# Patient Record
Sex: Male | Born: 1950
Health system: Southern US, Community
[De-identification: ages and names within clinical notes are randomized; demographics above are authoritative.]

## PROBLEM LIST (undated history)

## (undated) DIAGNOSIS — M109 Gout, unspecified: Secondary | ICD-10-CM

## (undated) DIAGNOSIS — Z87442 Personal history of urinary calculi: Secondary | ICD-10-CM

## (undated) DIAGNOSIS — M810 Age-related osteoporosis without current pathological fracture: Secondary | ICD-10-CM

## (undated) DIAGNOSIS — IMO0001 Reserved for inherently not codable concepts without codable children: Secondary | ICD-10-CM

## (undated) DIAGNOSIS — K219 Gastro-esophageal reflux disease without esophagitis: Secondary | ICD-10-CM

## (undated) DIAGNOSIS — E119 Type 2 diabetes mellitus without complications: Secondary | ICD-10-CM

## (undated) DIAGNOSIS — I10 Essential (primary) hypertension: Secondary | ICD-10-CM

## (undated) HISTORY — DX: Gastro-esophageal reflux disease without esophagitis: K21.9

## (undated) HISTORY — DX: Reserved for inherently not codable concepts without codable children: IMO0001

## (undated) HISTORY — PX: LITHOTRIPSY: SUR834

---

## 1998-03-20 ENCOUNTER — Emergency Department (HOSPITAL_COMMUNITY): Admission: EM | Admit: 1998-03-20 | Discharge: 1998-03-21 | Payer: Self-pay | Admitting: Emergency Medicine

## 1999-09-27 ENCOUNTER — Encounter: Payer: Self-pay | Admitting: Emergency Medicine

## 1999-09-27 ENCOUNTER — Emergency Department (HOSPITAL_COMMUNITY): Admission: EM | Admit: 1999-09-27 | Discharge: 1999-09-27 | Payer: Self-pay | Admitting: Emergency Medicine

## 1999-10-01 ENCOUNTER — Encounter: Payer: Self-pay | Admitting: Urology

## 1999-10-01 ENCOUNTER — Ambulatory Visit (HOSPITAL_COMMUNITY): Admission: RE | Admit: 1999-10-01 | Discharge: 1999-10-01 | Payer: Self-pay | Admitting: Urology

## 1999-10-01 ENCOUNTER — Encounter: Admission: RE | Admit: 1999-10-01 | Discharge: 1999-10-01 | Payer: Self-pay | Admitting: Urology

## 1999-10-06 ENCOUNTER — Encounter: Payer: Self-pay | Admitting: Urology

## 1999-10-08 ENCOUNTER — Encounter: Payer: Self-pay | Admitting: Urology

## 1999-10-08 ENCOUNTER — Ambulatory Visit (HOSPITAL_COMMUNITY): Admission: RE | Admit: 1999-10-08 | Discharge: 1999-10-08 | Payer: Self-pay | Admitting: Urology

## 1999-11-02 ENCOUNTER — Encounter: Payer: Self-pay | Admitting: Urology

## 1999-11-02 ENCOUNTER — Ambulatory Visit (HOSPITAL_COMMUNITY): Admission: RE | Admit: 1999-11-02 | Discharge: 1999-11-02 | Payer: Self-pay | Admitting: Urology

## 2000-08-12 ENCOUNTER — Encounter: Payer: Self-pay | Admitting: Family Medicine

## 2000-08-12 ENCOUNTER — Encounter: Admission: RE | Admit: 2000-08-12 | Discharge: 2000-08-12 | Payer: Self-pay | Admitting: Family Medicine

## 2001-02-17 ENCOUNTER — Encounter: Payer: Self-pay | Admitting: Family Medicine

## 2001-02-17 ENCOUNTER — Encounter: Admission: RE | Admit: 2001-02-17 | Discharge: 2001-02-17 | Payer: Self-pay | Admitting: Family Medicine

## 2001-10-04 ENCOUNTER — Ambulatory Visit (HOSPITAL_COMMUNITY): Admission: RE | Admit: 2001-10-04 | Discharge: 2001-10-04 | Payer: Self-pay | Admitting: Gastroenterology

## 2001-10-04 ENCOUNTER — Encounter (INDEPENDENT_AMBULATORY_CARE_PROVIDER_SITE_OTHER): Payer: Self-pay | Admitting: *Deleted

## 2002-09-03 ENCOUNTER — Encounter: Payer: Self-pay | Admitting: Family Medicine

## 2002-09-03 ENCOUNTER — Encounter: Admission: RE | Admit: 2002-09-03 | Discharge: 2002-09-03 | Payer: Self-pay | Admitting: Family Medicine

## 2002-10-27 ENCOUNTER — Emergency Department (HOSPITAL_COMMUNITY): Admission: EM | Admit: 2002-10-27 | Discharge: 2002-10-27 | Payer: Self-pay | Admitting: Emergency Medicine

## 2002-10-27 ENCOUNTER — Encounter: Payer: Self-pay | Admitting: Emergency Medicine

## 2002-10-29 ENCOUNTER — Encounter: Admission: RE | Admit: 2002-10-29 | Discharge: 2002-10-29 | Payer: Self-pay | Admitting: Urology

## 2002-10-29 ENCOUNTER — Encounter: Payer: Self-pay | Admitting: Urology

## 2002-10-31 ENCOUNTER — Encounter: Admission: RE | Admit: 2002-10-31 | Discharge: 2002-10-31 | Payer: Self-pay | Admitting: Urology

## 2002-10-31 ENCOUNTER — Encounter: Payer: Self-pay | Admitting: Urology

## 2002-11-01 ENCOUNTER — Ambulatory Visit (HOSPITAL_BASED_OUTPATIENT_CLINIC_OR_DEPARTMENT_OTHER): Admission: RE | Admit: 2002-11-01 | Discharge: 2002-11-01 | Payer: Self-pay | Admitting: Urology

## 2002-11-01 ENCOUNTER — Encounter: Payer: Self-pay | Admitting: Urology

## 2002-11-14 ENCOUNTER — Encounter: Admission: RE | Admit: 2002-11-14 | Discharge: 2002-11-14 | Payer: Self-pay | Admitting: Urology

## 2002-11-14 ENCOUNTER — Encounter: Payer: Self-pay | Admitting: Urology

## 2002-12-07 ENCOUNTER — Encounter: Admission: RE | Admit: 2002-12-07 | Discharge: 2002-12-07 | Payer: Self-pay | Admitting: Urology

## 2002-12-07 ENCOUNTER — Encounter: Payer: Self-pay | Admitting: Urology

## 2002-12-17 ENCOUNTER — Ambulatory Visit (HOSPITAL_BASED_OUTPATIENT_CLINIC_OR_DEPARTMENT_OTHER): Admission: RE | Admit: 2002-12-17 | Discharge: 2002-12-17 | Payer: Self-pay | Admitting: Urology

## 2002-12-17 ENCOUNTER — Encounter: Payer: Self-pay | Admitting: Urology

## 2002-12-27 ENCOUNTER — Encounter: Payer: Self-pay | Admitting: Urology

## 2002-12-27 ENCOUNTER — Encounter: Admission: RE | Admit: 2002-12-27 | Discharge: 2002-12-27 | Payer: Self-pay | Admitting: Urology

## 2003-02-27 ENCOUNTER — Encounter: Payer: Self-pay | Admitting: Family Medicine

## 2003-02-27 ENCOUNTER — Encounter: Admission: RE | Admit: 2003-02-27 | Discharge: 2003-02-27 | Payer: Self-pay | Admitting: Family Medicine

## 2004-11-17 ENCOUNTER — Ambulatory Visit (HOSPITAL_COMMUNITY): Admission: RE | Admit: 2004-11-17 | Discharge: 2004-11-17 | Payer: Self-pay | Admitting: Gastroenterology

## 2004-11-17 ENCOUNTER — Encounter (INDEPENDENT_AMBULATORY_CARE_PROVIDER_SITE_OTHER): Payer: Self-pay | Admitting: *Deleted

## 2004-12-12 ENCOUNTER — Emergency Department (HOSPITAL_COMMUNITY): Admission: EM | Admit: 2004-12-12 | Discharge: 2004-12-12 | Payer: Self-pay | Admitting: *Deleted

## 2005-05-01 ENCOUNTER — Emergency Department (HOSPITAL_COMMUNITY): Admission: EM | Admit: 2005-05-01 | Discharge: 2005-05-01 | Payer: Self-pay | Admitting: Emergency Medicine

## 2005-06-08 ENCOUNTER — Encounter: Admission: RE | Admit: 2005-06-08 | Discharge: 2005-08-19 | Payer: Self-pay | Admitting: Orthopedic Surgery

## 2005-12-02 ENCOUNTER — Encounter: Admission: RE | Admit: 2005-12-02 | Discharge: 2005-12-02 | Payer: Self-pay | Admitting: Family Medicine

## 2008-08-21 ENCOUNTER — Emergency Department (HOSPITAL_BASED_OUTPATIENT_CLINIC_OR_DEPARTMENT_OTHER): Admission: EM | Admit: 2008-08-21 | Discharge: 2008-08-21 | Payer: Self-pay | Admitting: Emergency Medicine

## 2008-09-09 ENCOUNTER — Encounter: Admission: RE | Admit: 2008-09-09 | Discharge: 2008-09-09 | Payer: Self-pay | Admitting: Family Medicine

## 2008-11-27 ENCOUNTER — Ambulatory Visit (HOSPITAL_COMMUNITY): Admission: RE | Admit: 2008-11-27 | Discharge: 2008-11-27 | Payer: Self-pay | Admitting: Family Medicine

## 2010-03-02 ENCOUNTER — Ambulatory Visit (HOSPITAL_COMMUNITY): Admission: RE | Admit: 2010-03-02 | Discharge: 2010-03-02 | Payer: Self-pay | Admitting: Family Medicine

## 2010-06-05 ENCOUNTER — Encounter: Admission: RE | Admit: 2010-06-05 | Discharge: 2010-06-05 | Payer: Self-pay | Admitting: Family Medicine

## 2010-09-25 ENCOUNTER — Encounter: Admission: RE | Admit: 2010-09-25 | Discharge: 2010-09-25 | Payer: Self-pay | Admitting: Family Medicine

## 2011-05-07 NOTE — Op Note (Signed)
NAME:  Geoffrey Castro, Geoffrey Castro           ACCOUNT NO.:  [id number]   MEDICAL RECORD NO.:  [id number]          PATIENT TYPE:  AMB   LOCATION:  ENDO                         FACILITY:  [facility]   PHYSICIAN:  Jordan Hawks. Elnoria Howard, MD    DATE OF BIRTH:  [birth year]   DATE OF PROCEDURE:  11/17/2004  DATE OF DISCHARGE:                                 OPERATIVE REPORT   REFERRING PHYSICIAN:  Renaye Rakers, M.D.   PROCEDURE:  Colonoscopy.   ENDOSCOPIST:  Jordan Hawks. Elnoria Howard, M.D.   CONSENT:  Informed consent was obtained from the patient, describing the  risks of bleeding, infection, perforation, medication reaction, a 10% missed  rate for small colon cancer or polyp, and the risk of death, all of which  are not exclusive of any other complication that may occur.   PHYSICAL EXAMINATION:  CARDIAC:  Regular rate and rhythm.  LUNGS:  Clear to auscultation bilaterally.  ABDOMEN:  Obese, soft, nontender, nondistended.   MEDICATIONS:  1.  Versed 10 mg IV.  2.  Demerol 100 mg IV.   CONSENT:  Informed consent was obtained from the patient, describing the  risks of bleeding, infection, perforation, medication reactions, a 10% risk  of missing a small colon cancer, and risk of death, all of which are not  exclusive of any complications that can occur.   PROCEDURE:  The patient was placed in left lateral decubitus position, and  after adequate sedation was achieved, a rectal examination was performed  which was negative for any palpable abnormalities. The colonoscope was then  introduced from the anus and advanced under direct visualization to the  terminal ileum without difficulty. The patient was noted to have a good  prep. Photo documentation of the terminal ileum and cecum was obtained. Upon  slow withdrawn of the colonoscopy, there was no evidence of any  abnormalities in the cecum. In the ascending colon, there is evidence of a 3-  mm sessile polyp which was removed with a cold snare polypectomy. Again, in  the transverse colon, a 3-mm polyp was removed with the cold snare  polypectomy. Otherwise, the remainder of the colon did not exhibit any  masses polyps, inflammation, ulcerations, or erosions, vascular  abnormalities, or diverticula. With retroflection in the rectum, the patient  is noted to have mild internal hemorrhoids. The colonoscope was then  straightened and withdrawn from the patient, and the procedure was  terminated. No complications were encountered.   PLAN:  1.  Follow up the biopsies.  2.  Repeat colonoscopy in 5 years.       PDH/MEDQ  D:  11/17/2004  T:  11/17/2004  Job:  [id number]   cc:   Renaye Rakers, M.D.  [phone] [address]  [place]  [place] [address]  Fax: [phone]

## 2011-05-07 NOTE — Op Note (Signed)
[facility]  Patient:    Geoffrey Castro, Geoffrey Castro Visit Number: [id number] MRN: [id number]          Service Type: END Location: ENDO Attending Physician:  Charna Elizabeth Proc. Date: 10/04/01 Admit Date:  10/04/2001   CC:         Geraldo Pitter, M.D.   Operative Report  DATE OF BIRTH:  12-Jun-1951  PROCEDURE:  Colonoscopy with snare polypectomy x 2.  ENDOSCOPIST:  Anselmo Rod, M.D.  INSTRUMENT:  Olympus video panendoscope.  INDICATIONS:  A 60 year old African-American male with a history of blood in stool, rule out colonic polyps, masses, hemorrhoids, etc.  PREPROCEDURE PREPARATION:  Informed consent was procured from the patient. The patient was fasted for eight hours prior to the procedure and prepped with a bottle of magnesium citrate and a gallon of NuLytely the night prior to the procedure.  PREPROCEDURE PHYSICAL EXAMINATION:  The patient had stable vital signs.  NECK: Supple.  CHEST: Clear to auscultation.  S1 and S2 regular.  ABDOMEN: Soft with normal bowel sounds.  DESCRIPTION OF PROCEDURE:  The patient was placed in the left lateral decubitus position and sedated with 50 mg of Demerol and 7 mg of Versed intravenously.  Once the patient was adequately sedated and maintained on low flow oxygen and continuous cardiac monitoring, the Olympus video colonoscope was advanced from the rectum to the cecum without difficulty.  The patient had a fairly good prep.  There was prominent internal hemorrhoids seen on retroflexion.  There was early left-sided diverticulosis noted.  Two small sessile polyps were snared from 25 cm and 70 cm respectively.  The transverse colon, right colon, and the cecum appeared normal and without lesions.  The patient tolerated the procedure well without complications.  IMPRESSION: 1. Moderate size nonbleeding internal hemorrhoid. 2. Left-sided diverticulosis. 3. Two small sessile polyps snared from the  left colon. 4. Normal appearing cecum, right colon, transverse colon.  RECOMMENDATIONS:  Await pathology results.  Avoid all nonsteroidals for the next two weeks.  Outpatient follow-up in the next two weeks. Attending Physician:  Charna Elizabeth DD:  10/04/01 TD:  10/04/01 Job: [id number]

## 2011-09-22 LAB — DIFFERENTIAL
Basophils Absolute: 0.1
Basophils Relative: 1
Eosinophils Absolute: 0.1
Eosinophils Relative: 1
Lymphocytes Relative: 24
Lymphs Abs: 2.3
Monocytes Absolute: 1.1 — ABNORMAL HIGH
Monocytes Relative: 12
Neutro Abs: 5.7
Neutrophils Relative %: 62

## 2011-09-22 LAB — CBC
HCT: 46.3
Hemoglobin: 15.5
MCHC: 33.5
MCV: 96.2
Platelets: 271
RBC: 4.81
RDW: 12.5
WBC: 9.3

## 2011-09-22 LAB — BASIC METABOLIC PANEL
BUN: 13
CO2: 26
Calcium: 9.1
Chloride: 107
Creatinine, Ser: 1.1
GFR calc Af Amer: >60
GFR calc non Af Amer: >60
Glucose, Bld: 101 — ABNORMAL HIGH
Potassium: 3.3 — ABNORMAL LOW
Sodium: 144

## 2014-01-04 ENCOUNTER — Other Ambulatory Visit: Payer: Self-pay | Admitting: Family Medicine

## 2014-01-04 ENCOUNTER — Ambulatory Visit
Admission: RE | Admit: 2014-01-04 | Discharge: 2014-01-04 | Disposition: A | Discharge status: Home / Self Care | Payer: 59 | Source: Ambulatory Visit | Attending: Family Medicine | Admitting: Family Medicine

## 2014-01-04 DIAGNOSIS — R102 Pelvic and perineal pain: Secondary | ICD-10-CM

## 2014-12-24 DIAGNOSIS — M79675 Pain in left toe(s): Secondary | ICD-10-CM | POA: Diagnosis not present

## 2014-12-24 DIAGNOSIS — L03031 Cellulitis of right toe: Secondary | ICD-10-CM | POA: Diagnosis not present

## 2014-12-31 DIAGNOSIS — I739 Peripheral vascular disease, unspecified: Secondary | ICD-10-CM | POA: Diagnosis not present

## 2014-12-31 DIAGNOSIS — L84 Corns and callosities: Secondary | ICD-10-CM | POA: Diagnosis not present

## 2014-12-31 DIAGNOSIS — E1151 Type 2 diabetes mellitus with diabetic peripheral angiopathy without gangrene: Secondary | ICD-10-CM | POA: Diagnosis not present

## 2014-12-31 DIAGNOSIS — M89371 Hypertrophy of bone, right ankle and foot: Secondary | ICD-10-CM | POA: Diagnosis not present

## 2014-12-31 DIAGNOSIS — M79671 Pain in right foot: Secondary | ICD-10-CM | POA: Diagnosis not present

## 2015-03-19 DIAGNOSIS — I10 Essential (primary) hypertension: Secondary | ICD-10-CM | POA: Diagnosis not present

## 2015-03-19 DIAGNOSIS — M1A09X0 Idiopathic chronic gout, multiple sites, without tophus (tophi): Secondary | ICD-10-CM | POA: Diagnosis not present

## 2015-03-19 DIAGNOSIS — E0800 Diabetes mellitus due to underlying condition with hyperosmolarity without nonketotic hyperglycemic-hyperosmolar coma (NKHHC): Secondary | ICD-10-CM | POA: Diagnosis not present

## 2015-03-20 DIAGNOSIS — N5201 Erectile dysfunction due to arterial insufficiency: Secondary | ICD-10-CM | POA: Diagnosis not present

## 2015-03-20 DIAGNOSIS — R351 Nocturia: Secondary | ICD-10-CM | POA: Diagnosis not present

## 2015-03-20 DIAGNOSIS — N401 Enlarged prostate with lower urinary tract symptoms: Secondary | ICD-10-CM | POA: Diagnosis not present

## 2015-07-09 DIAGNOSIS — D123 Benign neoplasm of transverse colon: Secondary | ICD-10-CM | POA: Diagnosis not present

## 2015-07-09 DIAGNOSIS — K5730 Diverticulosis of large intestine without perforation or abscess without bleeding: Secondary | ICD-10-CM | POA: Diagnosis not present

## 2015-07-09 DIAGNOSIS — K635 Polyp of colon: Secondary | ICD-10-CM | POA: Diagnosis not present

## 2015-07-09 DIAGNOSIS — Z1211 Encounter for screening for malignant neoplasm of colon: Secondary | ICD-10-CM | POA: Diagnosis not present

## 2015-07-09 DIAGNOSIS — D125 Benign neoplasm of sigmoid colon: Secondary | ICD-10-CM | POA: Diagnosis not present

## 2015-07-09 DIAGNOSIS — Z86010 Personal history of colonic polyps: Secondary | ICD-10-CM | POA: Diagnosis not present

## 2015-08-17 ENCOUNTER — Encounter (HOSPITAL_COMMUNITY): Payer: Self-pay

## 2015-08-17 ENCOUNTER — Emergency Department (HOSPITAL_COMMUNITY)
Admission: EM | Admit: 2015-08-17 | Discharge: 2015-08-17 | Disposition: A | Discharge status: Home / Self Care | Payer: Medicare Other | Attending: Emergency Medicine | Admitting: Emergency Medicine

## 2015-08-17 DIAGNOSIS — I10 Essential (primary) hypertension: Secondary | ICD-10-CM | POA: Diagnosis not present

## 2015-08-17 DIAGNOSIS — Z87448 Personal history of other diseases of urinary system: Secondary | ICD-10-CM | POA: Insufficient documentation

## 2015-08-17 DIAGNOSIS — Z720 Tobacco use: Secondary | ICD-10-CM | POA: Diagnosis not present

## 2015-08-17 DIAGNOSIS — K625 Hemorrhage of anus and rectum: Secondary | ICD-10-CM | POA: Insufficient documentation

## 2015-08-17 DIAGNOSIS — K644 Residual hemorrhoidal skin tags: Secondary | ICD-10-CM | POA: Insufficient documentation

## 2015-08-17 DIAGNOSIS — M109 Gout, unspecified: Secondary | ICD-10-CM | POA: Diagnosis not present

## 2015-08-17 DIAGNOSIS — E119 Type 2 diabetes mellitus without complications: Secondary | ICD-10-CM | POA: Diagnosis not present

## 2015-08-17 HISTORY — DX: Essential (primary) hypertension: I10

## 2015-08-17 HISTORY — DX: Type 2 diabetes mellitus without complications: E11.9

## 2015-08-17 HISTORY — DX: Age-related osteoporosis without current pathological fracture: M81.0

## 2015-08-17 HISTORY — DX: Gout, unspecified: M10.9

## 2015-08-17 LAB — ABO/RH: ABO/RH(D): O POS

## 2015-08-17 LAB — COMPREHENSIVE METABOLIC PANEL
ALT: 30 U/L (ref 17–63)
AST: 25 U/L (ref 15–41)
Albumin: 4.2 g/dL (ref 3.5–5.0)
Alkaline Phosphatase: 80 U/L (ref 38–126)
Anion gap: 12 (ref 5–15)
BUN: 10 mg/dL (ref 6–20)
CO2: 24 mmol/L (ref 22–32)
Calcium: 9.3 mg/dL (ref 8.9–10.3)
Chloride: 106 mmol/L (ref 101–111)
Creatinine, Ser: 0.89 mg/dL (ref 0.61–1.24)
GFR calc Af Amer: >60 mL/min (ref >60)
GFR calc non Af Amer: >60 mL/min (ref >60)
Glucose, Bld: 148 mg/dL — ABNORMAL HIGH (ref 65–99)
Potassium: 3.3 mmol/L — ABNORMAL LOW (ref 3.5–5.1)
Sodium: 142 mmol/L (ref 135–145)
Total Bilirubin: 0.8 mg/dL (ref 0.3–1.2)
Total Protein: 7.6 g/dL (ref 6.5–8.1)

## 2015-08-17 LAB — TYPE AND SCREEN
ABO/RH(D): O POS
Antibody Screen: NEGATIVE

## 2015-08-17 LAB — CBC
HCT: 49.8 % (ref 39.0–52.0)
Hemoglobin: 18 g/dL — ABNORMAL HIGH (ref 13.0–17.0)
MCH: 33.5 pg (ref 26.0–34.0)
MCHC: 36.1 g/dL — ABNORMAL HIGH (ref 30.0–36.0)
MCV: 92.6 fL (ref 78.0–100.0)
Platelets: 261 10*3/uL (ref 150–400)
RBC: 5.38 MIL/uL (ref 4.22–5.81)
RDW: 14.4 % (ref 11.5–15.5)
WBC: 9.2 10*3/uL (ref 4.0–10.5)

## 2015-08-17 LAB — POC OCCULT BLOOD, ED: Fecal Occult Bld: NEGATIVE

## 2015-08-17 NOTE — Discharge Instructions (Signed)
As discussed, your evaluation today has been largely reassuring.  But, it is important that you monitor your condition carefully, and do not hesitate to return to the ED if you develop new, or concerning changes in your condition. ? ?Otherwise, please follow-up with your physician for appropriate ongoing care. ? ?

## 2015-08-17 NOTE — ED Provider Notes (Signed)
CSN: [id number]     Arrival date & time 08/17/15  1222 History   First MD Initiated Contact with Patient 08/17/15 1257     Chief Complaint  Patient presents with  . Rectal Bleeding     (Consider location/radiation/quality/duration/timing/severity/associated sxs/prior Treatment) HPI Patient presents with concern of rectal bleeding, lightheadedness. Symptoms began 3 days ago. Since onset patient has had multiple bowel movements with red blood visible. Not all bowel movements have blood, and there is no bleeding associated with no bowel movements. Patient denies other substantial complex, including no abdominal pain, rectal pain, chest pain, dyspnea. He has mild lightheadedness, though this is inconsistent.  Patient took laxatives within the past week several times due to new constipation  Last colonoscopy 3 years ago, no history of hemorrhoids. Past Medical History  Diagnosis Date  . Hypertension   . Diabetes mellitus without complication   . Osteoporosis   . Gout   . Renal disorder     kidney stones   Past Surgical History  Procedure Laterality Date  . Lithotripsy     History reviewed. No pertinent family history. Social History  Substance Use Topics  . Smoking status: Current Every Day Smoker -- 1.00 packs/day    Types: Cigarettes  . Smokeless tobacco: None  . Alcohol Use: 2.4 oz/week    4 Shots of liquor per week    Review of Systems  Constitutional:       Per HPI, otherwise negative  HENT:       Per HPI, otherwise negative  Respiratory:       Per HPI, otherwise negative  Cardiovascular:       Per HPI, otherwise negative  Gastrointestinal: Negative for vomiting.  Endocrine:       Negative aside from HPI  Genitourinary:       Neg aside from HPI   Musculoskeletal:       Per HPI, otherwise negative  Skin: Negative.   Neurological: Positive for light-headedness. Negative for syncope.      Allergies  Review of patient's allergies indicates no known  allergies.  Home Medications   Prior to Admission medications   Not on File   BP 155/98 mmHg  Pulse 92  Temp(Src) 98.3 F (36.8 C) (Oral)  Resp 16  Ht 5\' 8"  (1.727 m)  Wt 240 lb (108.863 kg)  BMI 36.50 kg/m2  SpO2 93% Physical Exam  Constitutional: He is oriented to person, place, and time. He appears well-developed. No distress.  HENT:  Head: Normocephalic and atraumatic.  Eyes: Conjunctivae and EOM are normal.  Cardiovascular: Normal rate and regular rhythm.   Pulmonary/Chest: Effort normal. No stridor. No respiratory distress.  Abdominal: He exhibits no distension.  Genitourinary: Rectal exam shows external hemorrhoid. Rectal exam shows no fissure, no mass and anal tone normal. Prostate is not tender.     Musculoskeletal: He exhibits no edema.  Neurological: He is alert and oriented to person, place, and time.  Skin: Skin is warm and dry.  Psychiatric: He has a normal mood and affect.  Nursing note and vitals reviewed.   ED Course  Procedures (including critical care time) Labs Review Labs Reviewed  COMPREHENSIVE METABOLIC PANEL - Abnormal; Notable for the following:    Potassium 3.3 (*)    Glucose, Bld 148 (*)    All other components within normal limits  CBC - Abnormal; Notable for the following:    Hemoglobin 18.0 (*)    MCHC 36.1 (*)    All other components within  normal limits  POC OCCULT BLOOD, ED  POC OCCULT BLOOD, ED  TYPE AND SCREEN  ABO/RH    Heme occult negative  Imaging Review No results found. I have personally reviewed and evaluated these images and lab results as part of my medical decision-making.   On repeat exam the patient is calm, in no distress.  MDM  Patient presents with concern of bright red blood per rectum. Here the patient is awake, alert, afebrile, with hemoglobin stable, blood pressure is stable. No evidence for substantial ongoing bleed.  Patient does have multiple external hemorrhoids, likely contributory.  Patient  has a gastroenterologist with annual follow-up.    Carmin Muskrat, MD 08/17/15 [phone]

## 2015-08-17 NOTE — ED Notes (Signed)
Onset 2 days ago bright red bleeding when passing loose stools.  Passed 3-4 on Friday, Saturday, x 2 today.  Pt usually has 3 stools per day so this is not unusual. The week prior pt was constipated and had to take laxatives for several days to get BM back to normal.  Last laxative intake was 08-11-15.  Pt reports intermittant lightheadedness x 5 since Friday.

## 2015-08-19 DIAGNOSIS — K5900 Constipation, unspecified: Secondary | ICD-10-CM | POA: Diagnosis not present

## 2015-08-19 DIAGNOSIS — K648 Other hemorrhoids: Secondary | ICD-10-CM | POA: Diagnosis not present

## 2015-08-19 DIAGNOSIS — K625 Hemorrhage of anus and rectum: Secondary | ICD-10-CM | POA: Diagnosis not present

## 2015-08-19 DIAGNOSIS — K219 Gastro-esophageal reflux disease without esophagitis: Secondary | ICD-10-CM | POA: Diagnosis not present

## 2015-09-15 DIAGNOSIS — I10 Essential (primary) hypertension: Secondary | ICD-10-CM | POA: Diagnosis not present

## 2015-09-15 DIAGNOSIS — E0800 Diabetes mellitus due to underlying condition with hyperosmolarity without nonketotic hyperglycemic-hyperosmolar coma (NKHHC): Secondary | ICD-10-CM | POA: Diagnosis not present

## 2015-09-15 DIAGNOSIS — Z23 Encounter for immunization: Secondary | ICD-10-CM | POA: Diagnosis not present

## 2015-09-15 DIAGNOSIS — M1009 Idiopathic gout, multiple sites: Secondary | ICD-10-CM | POA: Diagnosis not present

## 2016-01-23 DIAGNOSIS — M1009 Idiopathic gout, multiple sites: Secondary | ICD-10-CM | POA: Diagnosis not present

## 2016-01-23 DIAGNOSIS — I10 Essential (primary) hypertension: Secondary | ICD-10-CM | POA: Diagnosis not present

## 2016-01-23 DIAGNOSIS — E089 Diabetes mellitus due to underlying condition without complications: Secondary | ICD-10-CM | POA: Diagnosis not present

## 2016-02-19 ENCOUNTER — Ambulatory Visit
Admission: RE | Admit: 2016-02-19 | Discharge: 2016-02-19 | Disposition: A | Discharge status: Home / Self Care | Payer: Medicare Other | Source: Ambulatory Visit | Attending: Family Medicine | Admitting: Family Medicine

## 2016-02-19 ENCOUNTER — Other Ambulatory Visit: Payer: Self-pay | Admitting: Family Medicine

## 2016-02-19 DIAGNOSIS — S76919S Strain of unspecified muscles, fascia and tendons at thigh level, unspecified thigh, sequela: Secondary | ICD-10-CM | POA: Diagnosis not present

## 2016-02-19 DIAGNOSIS — I10 Essential (primary) hypertension: Secondary | ICD-10-CM | POA: Diagnosis not present

## 2016-02-19 DIAGNOSIS — M1009 Idiopathic gout, multiple sites: Secondary | ICD-10-CM | POA: Diagnosis not present

## 2016-02-19 DIAGNOSIS — Z0000 Encounter for general adult medical examination without abnormal findings: Secondary | ICD-10-CM | POA: Diagnosis not present

## 2016-02-19 DIAGNOSIS — M160 Bilateral primary osteoarthritis of hip: Secondary | ICD-10-CM | POA: Diagnosis not present

## 2016-02-19 DIAGNOSIS — S76912A Strain of unspecified muscles, fascia and tendons at thigh level, left thigh, initial encounter: Secondary | ICD-10-CM

## 2016-03-11 DIAGNOSIS — N401 Enlarged prostate with lower urinary tract symptoms: Secondary | ICD-10-CM | POA: Diagnosis not present

## 2016-03-11 DIAGNOSIS — Z0000 Encounter for general adult medical examination without abnormal findings: Secondary | ICD-10-CM | POA: Diagnosis not present

## 2016-03-19 DIAGNOSIS — S78919A Complete traumatic amputation of unspecified hip and thigh, level unspecified, initial encounter: Secondary | ICD-10-CM | POA: Diagnosis not present

## 2016-03-19 DIAGNOSIS — M1009 Idiopathic gout, multiple sites: Secondary | ICD-10-CM | POA: Diagnosis not present

## 2016-03-19 DIAGNOSIS — I10 Essential (primary) hypertension: Secondary | ICD-10-CM | POA: Diagnosis not present

## 2016-03-25 ENCOUNTER — Ambulatory Visit: Payer: Self-pay | Admitting: Podiatry

## 2016-06-11 ENCOUNTER — Encounter: Payer: Self-pay | Admitting: Podiatry

## 2016-06-11 ENCOUNTER — Ambulatory Visit (INDEPENDENT_AMBULATORY_CARE_PROVIDER_SITE_OTHER): Payer: Medicare Other | Admitting: Podiatry

## 2016-06-11 VITALS — BP 145/91 | HR 83 | Resp 14

## 2016-06-11 DIAGNOSIS — B351 Tinea unguium: Secondary | ICD-10-CM | POA: Diagnosis not present

## 2016-06-11 DIAGNOSIS — Q828 Other specified congenital malformations of skin: Secondary | ICD-10-CM

## 2016-06-11 DIAGNOSIS — M79676 Pain in unspecified toe(s): Secondary | ICD-10-CM

## 2016-06-11 NOTE — Progress Notes (Signed)
   Subjective:    Patient ID: Geoffrey Castro, male    DOB: 04/19/1951, 65 y.o.   MRN: [id number]  HPI this patient presents to the office saying that his nails have grown long and thick. He states that the nails are painful walking and wearing his shoes.  He also says he has a severely painful callus under the ball of his right foot. This calluses painful walking and wearing his shoes. This patient is a diabetic and he presents the office today for a diabetic foot exam as well as treatment of his nails and callus    Review of Systems  Psychiatric/Behavioral:       Anxiety       Objective:   Physical Exam GENERAL APPEARANCE: Alert, conversant. Appropriately groomed. No acute distress.  VASCULAR: Pedal pulses are  palpable at  [facility] and PT bilateral.  Capillary refill time is immediate to all digits,  Normal temperature gradient.  Digital hair growth is present bilateral  NEUROLOGIC: sensation is normal to 5.07 monofilament at 5/5 sites bilateral.  Light touch is intact bilateral, Muscle strength normal.  MUSCULOSKELETAL: acceptable muscle strength, tone and stability bilateral.  Intrinsic muscluature intact bilateral.  Rectus appearance of foot and digits noted bilateral.   DERMATOLOGIC: skin color, texture, and turgor are within normal limits.  No preulcerative lesions or ulcers  are seen, no interdigital maceration noted.  No open lesions present.  . No drainage noted.  Porokeratosis sub 1 right foot.  NAILS  Thick disfigured discolored nails both feet.         Assessment & Plan:  Onychomycosis  Porokeratosis right foot  IE  Debridement of nails.  Debridement of porokeratosis    RTC 3 months.  Diabetic foot exam.  Gardiner Barefoot DPM

## 2016-07-20 DIAGNOSIS — M1A09X0 Idiopathic chronic gout, multiple sites, without tophus (tophi): Secondary | ICD-10-CM | POA: Diagnosis not present

## 2016-07-20 DIAGNOSIS — I10 Essential (primary) hypertension: Secondary | ICD-10-CM | POA: Diagnosis not present

## 2016-07-20 DIAGNOSIS — D45 Polycythemia vera: Secondary | ICD-10-CM | POA: Diagnosis not present

## 2016-07-20 DIAGNOSIS — E089 Diabetes mellitus due to underlying condition without complications: Secondary | ICD-10-CM | POA: Diagnosis not present

## 2016-07-30 ENCOUNTER — Other Ambulatory Visit: Payer: Self-pay | Admitting: Pharmacist

## 2016-07-30 NOTE — Patient Outreach (Signed)
Outreach call to Geoffrey Castro regarding his request for follow up from the [facility] Medication Adherence Campaign. Left a HIPAA compliant message on the patient's voicemail.  Harlow Asa, PharmD Clinical Pharmacist [facility] [phone]

## 2016-09-14 ENCOUNTER — Ambulatory Visit: Payer: Medicare Other | Admitting: Podiatry

## 2016-10-11 ENCOUNTER — Encounter (HOSPITAL_COMMUNITY): Payer: Self-pay

## 2016-10-11 ENCOUNTER — Emergency Department (HOSPITAL_COMMUNITY)
Admission: EM | Admit: 2016-10-11 | Discharge: 2016-10-11 | Disposition: A | Discharge status: Home / Self Care | Payer: Medicare Other | Attending: Emergency Medicine | Admitting: Emergency Medicine

## 2016-10-11 ENCOUNTER — Emergency Department (HOSPITAL_COMMUNITY): Payer: Medicare Other

## 2016-10-11 DIAGNOSIS — F17210 Nicotine dependence, cigarettes, uncomplicated: Secondary | ICD-10-CM | POA: Diagnosis not present

## 2016-10-11 DIAGNOSIS — N132 Hydronephrosis with renal and ureteral calculous obstruction: Secondary | ICD-10-CM | POA: Diagnosis not present

## 2016-10-11 DIAGNOSIS — I10 Essential (primary) hypertension: Secondary | ICD-10-CM | POA: Diagnosis not present

## 2016-10-11 DIAGNOSIS — E119 Type 2 diabetes mellitus without complications: Secondary | ICD-10-CM | POA: Diagnosis not present

## 2016-10-11 DIAGNOSIS — N200 Calculus of kidney: Secondary | ICD-10-CM | POA: Diagnosis not present

## 2016-10-11 DIAGNOSIS — R109 Unspecified abdominal pain: Secondary | ICD-10-CM | POA: Diagnosis present

## 2016-10-11 LAB — URINALYSIS, ROUTINE W REFLEX MICROSCOPIC
Bilirubin Urine: NEGATIVE
Glucose, UA: NEGATIVE mg/dL
Hgb urine dipstick: NEGATIVE
Ketones, ur: NEGATIVE mg/dL
Leukocytes, UA: NEGATIVE
Nitrite: NEGATIVE
Protein, ur: NEGATIVE mg/dL
Specific Gravity, Urine: 1.019 (ref 1.005–1.030)
pH: 6.5 (ref 5.0–8.0)

## 2016-10-11 LAB — CBC WITH DIFFERENTIAL/PLATELET
Basophils Absolute: 0 10*3/uL (ref 0.0–0.1)
Basophils Relative: 0 %
Eosinophils Absolute: 0 10*3/uL (ref 0.0–0.7)
Eosinophils Relative: 0 %
HCT: 49.7 % (ref 39.0–52.0)
Hemoglobin: 18.3 g/dL — ABNORMAL HIGH (ref 13.0–17.0)
Lymphocytes Relative: 12 %
Lymphs Abs: 1.6 10*3/uL (ref 0.7–4.0)
MCH: 34 pg (ref 26.0–34.0)
MCHC: 36.8 g/dL — ABNORMAL HIGH (ref 30.0–36.0)
MCV: 92.4 fL (ref 78.0–100.0)
Monocytes Absolute: 1.4 10*3/uL — ABNORMAL HIGH (ref 0.1–1.0)
Monocytes Relative: 11 %
Neutro Abs: 10.2 10*3/uL — ABNORMAL HIGH (ref 1.7–7.7)
Neutrophils Relative %: 77 %
Platelets: 243 10*3/uL (ref 150–400)
RBC: 5.38 MIL/uL (ref 4.22–5.81)
RDW: 13.7 % (ref 11.5–15.5)
WBC: 13.3 10*3/uL — ABNORMAL HIGH (ref 4.0–10.5)

## 2016-10-11 LAB — COMPREHENSIVE METABOLIC PANEL
ALT: 24 U/L (ref 17–63)
AST: 25 U/L (ref 15–41)
Albumin: 4.5 g/dL (ref 3.5–5.0)
Alkaline Phosphatase: 78 U/L (ref 38–126)
Anion gap: 8 (ref 5–15)
BUN: 16 mg/dL (ref 6–20)
CO2: 24 mmol/L (ref 22–32)
Calcium: 9.1 mg/dL (ref 8.9–10.3)
Chloride: 108 mmol/L (ref 101–111)
Creatinine, Ser: 1.42 mg/dL — ABNORMAL HIGH (ref 0.61–1.24)
GFR calc Af Amer: 58 mL/min — ABNORMAL LOW (ref >60)
GFR calc non Af Amer: 50 mL/min — ABNORMAL LOW (ref >60)
Glucose, Bld: 134 mg/dL — ABNORMAL HIGH (ref 65–99)
Potassium: 3.8 mmol/L (ref 3.5–5.1)
Sodium: 140 mmol/L (ref 135–145)
Total Bilirubin: 0.9 mg/dL (ref 0.3–1.2)
Total Protein: 7.3 g/dL (ref 6.5–8.1)

## 2016-10-11 LAB — LIPASE, BLOOD: Lipase: 20 U/L (ref 11–51)

## 2016-10-11 MED ORDER — FENTANYL CITRATE (PF) 100 MCG/2ML IJ SOLN
50.0000 ug | INTRAMUSCULAR | Status: DC | PRN
  Administered 2016-10-11: 50 ug via NASAL
  Filled 2016-10-11: qty 2

## 2016-10-11 MED ORDER — TAMSULOSIN HCL 0.4 MG PO CAPS: 0.4000 mg | ORAL_CAPSULE | Freq: Every day | ORAL | 0 refills | Status: AC

## 2016-10-11 MED ORDER — DOCUSATE SODIUM 100 MG PO CAPS: 100.0000 mg | ORAL_CAPSULE | Freq: Two times a day (BID) | ORAL | 0 refills | Status: AC

## 2016-10-11 MED ORDER — KETOROLAC TROMETHAMINE 30 MG/ML IJ SOLN
30.0000 mg | Freq: Once | INTRAMUSCULAR | Status: AC
  Administered 2016-10-11: 30 mg via INTRAVENOUS
  Filled 2016-10-11: qty 1

## 2016-10-11 MED ORDER — OXYCODONE-ACETAMINOPHEN 5-325 MG PO TABS: 1.0000 | ORAL_TABLET | ORAL | 0 refills | Status: AC | PRN

## 2016-10-11 MED ORDER — POLYETHYLENE GLYCOL 3350 17 G PO PACK: 17.0000 g | PACK | Freq: Every day | ORAL | 0 refills | Status: AC

## 2016-10-11 MED ORDER — ONDANSETRON HCL 4 MG/2ML IJ SOLN
4.0000 mg | Freq: Once | INTRAMUSCULAR | Status: AC
  Administered 2016-10-11: 4 mg via INTRAVENOUS
  Filled 2016-10-11: qty 2

## 2016-10-11 NOTE — ED Triage Notes (Signed)
Patient c/o left flank pain that began today.  Patient states that has a Hx of kidney stones and has an appointment at the [facility] tomorrow that was made 3 months ago for an [place].  Patient denies difficulty urinating however, is having trouble having a BM.

## 2016-10-11 NOTE — ED Notes (Signed)
No respiratory or acute distress noted resting in bed with eyes closed call light in reach no reaction to medication noted. 

## 2016-10-11 NOTE — ED Provider Notes (Signed)
Emergency Department Provider Note   I have reviewed the triage vital signs and the nursing notes.   HISTORY  Chief Complaint Flank Pain   HPI Geoffrey Castro is a 65 y.o. male with PMH of prior kidney stones, HTN, and DM presents to the emergency department for evaluation of left flank pain that began yesterday. He reports intermittent symptoms for the past 1-2 days. He's taken Motrin at home with no relief. He denies associated fever. He does have constipation which is also giving him some discomfort. He states he is typically very regular and has been trying some natural remedies without relief. He has had nausea with no vomiting. No headache. No chest pain or difficulty breathing.     Past Medical History:  Diagnosis Date  . Diabetes mellitus without complication ([place])   . Gout   . Hypertension   . Osteoporosis   . Reflux   . Renal disorder    kidney stones    There are no active problems to display for this patient.   Past Surgical History:  Procedure Laterality Date  . LITHOTRIPSY      Current Outpatient Rx  . Order #: [id number] Class: Historical Med  . Order #: [id number] Class: Historical Med  . Order #: [id number] Class: Historical Med  . Order #: [id number] Class: Print  . Order #: [id number] Class: Print  . Order #: [id number] Class: Print  . Order #: [id number] Class: Print    Allergies Review of patient's allergies indicates no known allergies.  No family history on file.  Social History Social History  Substance Use Topics  . Smoking status: Current Every Day Smoker    Packs/day: 1.00    Types: Cigarettes  . Smokeless tobacco: Never Used  . Alcohol use 2.4 oz/week    4 Shots of liquor per week     Comment: occas.    Review of Systems  Constitutional: No fever/chills Eyes: No visual changes. ENT: No sore throat. Cardiovascular: Denies chest pain. Respiratory: Denies shortness of breath. Gastrointestinal: Positive left abdominal pain and  flank.  No nausea, no vomiting.  No diarrhea.  No constipation. Genitourinary: Negative for dysuria. Musculoskeletal: Negative for back pain. Skin: Negative for rash. Neurological: Negative for headaches, focal weakness or numbness.  10-point ROS otherwise negative.  ____________________________________________   PHYSICAL EXAM:  VITAL SIGNS: ED Triage Vitals  Enc Vitals Group     BP 10/11/16 0138 157/92     Pulse Rate 10/11/16 0138 95     Resp 10/11/16 0138 17     Temp 10/11/16 0138 98.2 F (36.8 C)     Temp Source 10/11/16 0138 Oral     SpO2 10/11/16 0138 95 %     Pain Score 10/11/16 0139 10   Constitutional: Alert and oriented. Appears moderately uncomfortable.  Eyes: Conjunctivae are normal.  Head: Atraumatic. Nose: No congestion/rhinnorhea. Mouth/Throat: Mucous membranes are moist.  Oropharynx non-erythematous. Neck: No stridor. Cardiovascular: Normal rate, regular rhythm. Good peripheral circulation. Grossly normal heart sounds.   Respiratory: Normal respiratory effort.  No retractions. Lungs CTAB. Gastrointestinal: Soft and non-tender to palpation. Mild left CVA tenderness. No distention.  Musculoskeletal: No lower extremity tenderness nor edema. No gross deformities of extremities. Neurologic:  Normal speech and language. No gross focal neurologic deficits are appreciated.  Skin:  Skin is warm, dry and intact. No rash noted. Psychiatric: Mood and affect are normal. Speech and behavior are normal.  ____________________________________________   LABS (all labs ordered are listed, but only abnormal results are displayed)  Labs  Reviewed  COMPREHENSIVE METABOLIC PANEL - Abnormal; Notable for the following:       Result Value   Glucose, Bld 134 (*)    Creatinine, Ser 1.42 (*)    GFR calc non Af Amer 50 (*)    GFR calc Af Amer 58 (*)    All other components within normal limits  CBC WITH DIFFERENTIAL/PLATELET - Abnormal; Notable for the following:    WBC 13.3 (*)     Hemoglobin 18.3 (*)    MCHC 36.8 (*)    Neutro Abs 10.2 (*)    Monocytes Absolute 1.4 (*)    All other components within normal limits  URINALYSIS, ROUTINE W REFLEX MICROSCOPIC (NOT AT [facility])  LIPASE, BLOOD   ____________________________________________  RADIOLOGY  Ct Renal Stone Study  Result Date: 10/11/2016 CLINICAL DATA:  64 year old male with left flank pain. EXAM: CT ABDOMEN AND PELVIS WITHOUT CONTRAST TECHNIQUE: Multidetector CT imaging of the abdomen and pelvis was performed following the standard protocol without IV contrast. COMPARISON:  Abdominal CT dated 08/11/2007 FINDINGS: Evaluation of this exam is limited in the absence of intravenous contrast. Lower chest: Left lung base atelectasis/ scarring. The visualized lung bases are otherwise clear. No intra-abdominal free air or free fluid. Hepatobiliary: Diffuse fatty infiltration of the liver. No intrahepatic biliary ductal dilatation. Multiple stones within the gallbladder. No pericholecystic fluid. Pancreas: Unremarkable. No pancreatic ductal dilatation or surrounding inflammatory changes. Spleen: Normal in size without focal abnormality. Adrenals/Urinary Tract: The adrenal glands appear unremarkable. There multiple nonobstructing bilateral renal calculi measuring up to 6 mm in the upper pole of the left kidney. There is a 5 mm stone in the left ureteropelvic junction with mild left hydronephrosis. The right ureter appears unremarkable. There is no hydronephrosis on the right. The urinary bladder appears unremarkable. Stomach/Bowel: There is sigmoid diverticulosis without active inflammatory changes. Moderate stool noted throughout the colon. No evidence of bowel obstruction or active inflammation. Normal appendix. Vascular/Lymphatic: Mild aortoiliac atherosclerotic disease. The abdominal aorta and IVC are grossly unremarkable on this noncontrast study. No portal venous gas identified. There is no adenopathy. Reproductive: The prostate  and seminal vesicles are grossly unremarkable. Other: None Musculoskeletal: There is osteopenia.  No acute fracture. IMPRESSION: A 5 mm left UPJ stone with mild left hydronephrosis. Correlation with urinalysis recommended to exclude superimposed UTI. Multiple other nonobstructing bilateral renal calculi. No hydronephrosis on the right. Fatty liver. Cholelithiasis. Electronically Signed   By: Anner Crete M.D.   On: 10/11/2016 06:15    ____________________________________________   PROCEDURES  Procedure(s) performed:   Procedures  None ____________________________________________   INITIAL IMPRESSION / ASSESSMENT AND PLAN / ED COURSE  Pertinent labs & imaging results that were available during my care of the patient were reviewed by me and considered in my medical decision making (see chart for details).  Patient with PMH of kidney stone presents to the ED with left flank pain consistent with prior kidney stones. No stone since 2004. Has associated constipation. Low suspicion for vascular etiology of pain. Plan for CT renal stone study, UA, and labs. No subjective or objective findings to suggest urosepsis of other infection.   06:32 AM CT scan reviewed with 5 mm left sided stone at UPJ. No evidence of UTI on urinalysis. No subjective or objective signs of infection. Plan for referral to urology. Patient will call later this morning. Pain is well controlled in the ED at this time. Plan to discharge home with pain medication, stool softener, Flomax.  At this time, I  do not feel there is any life-threatening condition present. I have reviewed and discussed all results (EKG, imaging, lab, urine as appropriate), exam findings with patient. I have reviewed nursing notes and appropriate previous records.  I feel the patient is safe to be discharged home without further emergent workup. Discussed usual and customary return precautions. Patient and family (if present) verbalize understanding and  are comfortable with this plan.  Patient will follow-up with their primary care provider. If they do not have a primary care provider, information for follow-up has been provided to them. All questions have been answered.  ____________________________________________  FINAL CLINICAL IMPRESSION(S) / ED DIAGNOSES  Final diagnoses:  Kidney stone     MEDICATIONS GIVEN DURING THIS VISIT:  Medications  ketorolac (TORADOL) 30 MG/ML injection 30 mg (30 mg Intravenous Given 10/11/16 0425)  ondansetron (ZOFRAN) injection 4 mg (4 mg Intravenous Given 10/11/16 0425)     NEW OUTPATIENT MEDICATIONS STARTED DURING THIS VISIT:  Discharge Medication List as of 10/11/2016  6:35 AM    START taking these medications   Details  docusate sodium (COLACE) 100 MG capsule Take 1 capsule (100 mg total) by mouth every 12 (twelve) hours., Starting Mon 10/11/2016, Print    oxyCODONE-acetaminophen (PERCOCET/ROXICET) 5-325 MG tablet Take 1-2 tablets by mouth every 4 (four) hours as needed for severe pain., Starting Mon 10/11/2016, Print    polyethylene glycol (MIRALAX / GLYCOLAX) packet Take 17 g by mouth daily., Starting Mon 10/11/2016, Print    tamsulosin (FLOMAX) 0.4 MG CAPS capsule Take 1 capsule (0.4 mg total) by mouth daily., Starting Mon 10/11/2016, Print        Note:  This document was prepared using Dragon voice recognition software and may include unintentional dictation errors.  Nanda Quinton, MD Emergency Medicine   Margette Fast, MD 10/11/16 [phone]

## 2016-10-11 NOTE — ED Notes (Signed)
No respiratory or acute distress noted resting in bed with eyes closed no reaction to medication noted call light in reach. 

## 2016-10-11 NOTE — Discharge Instructions (Signed)
You have been seen in the Emergency Department (ED) today for pain that we believe based on your workup, is caused by kidney stones.  As we have discussed, please drink plenty of fluids.  Please make a follow up appointment with the physician(s) listed elsewhere in this documentation. ° °You may take pain medication as needed but ONLY as prescribed.  Please also take your prescribed Flomax daily.  We also recommend that you take over-the-counter ibuprofen regularly according to label instructions over the next 5 days.  Take it with meals to minimize stomach discomfort. ° °Please see your doctor as soon as possible as stones may take 1-3 weeks to pass and you may require additional care or medications. ° °Do not drink alcohol, drive or participate in any other potentially dangerous activities while taking opiate pain medication as it may make you sleepy. Do not take this medication with any other sedating medications, either prescription or over-the-counter. If you were prescribed Percocet or Vicodin, do not take these with acetaminophen (Tylenol) as it is already contained within these medications. °  °Take Percocet as needed for severe pain.  This medication is an opiate (or narcotic) pain medication and can be habit forming.  Use it as little as possible to achieve adequate pain control.  Do not use or use it with extreme caution if you have a history of opiate abuse or dependence.  If you are on a pain contract with your primary care doctor or a pain specialist, be sure to let them know you were prescribed this medication today from the Emergency Department.  This medication is intended for your use only - do not give any to anyone else and keep it in a secure place where nobody else, especially children, have access to it.  It will also cause or worsen constipation, so you may want to consider taking an over-the-counter stool softener while you are taking this medication. ° °Return to the Emergency Department  (ED) or call your doctor if you have any worsening pain, fever, painful urination, are unable to urinate, or develop other symptoms that concern you. ° ° ° °Kidney Stones °Kidney stones (urolithiasis) are deposits that form inside your kidneys. The intense pain is caused by the stone moving through the urinary tract. When the stone moves, the ureter goes into spasm around the stone. The stone is usually passed in the urine.  °CAUSES  °A disorder that makes certain neck glands produce too much parathyroid hormone (primary hyperparathyroidism). °A buildup of uric acid crystals, similar to gout in your joints. °Narrowing (stricture) of the ureter. °A kidney obstruction present at birth (congenital obstruction). °Previous surgery on the kidney or ureters. °Numerous kidney infections. °SYMPTOMS  °Feeling sick to your stomach (nauseous). °Throwing up (vomiting). °Blood in the urine (hematuria). °Pain that usually spreads (radiates) to the groin. °Frequency or urgency of urination. °DIAGNOSIS  °Taking a history and physical exam. °Blood or urine tests. °CT scan. °Occasionally, an examination of the inside of the urinary bladder (cystoscopy) is performed. °TREATMENT  °Observation. °Increasing your fluid intake. °Extracorporeal shock wave lithotripsy--This is a noninvasive procedure that uses shock waves to break up kidney stones. °Surgery may be needed if you have severe pain or persistent obstruction. There are various surgical procedures. Most of the procedures are performed with the use of small instruments. Only small incisions are needed to accommodate these instruments, so recovery time is minimized. °The size, location, and chemical composition are all important variables that will determine the proper   choice of action for you. Talk to your health care provider to better understand your situation so that you will minimize the risk of injury to yourself and your kidney.  °HOME CARE INSTRUCTIONS  °Drink enough water  and fluids to keep your urine clear or pale yellow. This will help you to pass the stone or stone fragments. °Strain all urine through the provided strainer. Keep all particulate matter and stones for your health care provider to see. The stone causing the pain may be as small as a grain of salt. It is very important to use the strainer each and every time you pass your urine. The collection of your stone will allow your health care provider to analyze it and verify that a stone has actually passed. The stone analysis will often identify what you can do to reduce the incidence of recurrences. °Only take over-the-counter or prescription medicines for pain, discomfort, or fever as directed by your health care provider. °Keep all follow-up visits as told by your health care provider. This is important. °Get follow-up X-rays if required. The absence of pain does not always mean that the stone has passed. It may have only stopped moving. If the urine remains completely obstructed, it can cause loss of kidney function or even complete destruction of the kidney. It is your responsibility to make sure X-rays and follow-ups are completed. Ultrasounds of the kidney can show blockages and the status of the kidney. Ultrasounds are not associated with any radiation and can be performed easily in a matter of minutes. °Make changes to your daily diet as told by your health care provider. You may be told to: °Limit the amount of salt that you eat. °Eat 5 or more servings of fruits and vegetables each day. °Limit the amount of meat, poultry, fish, and eggs that you eat. °Collect a 24-hour urine sample as told by your health care provider. You may need to collect another urine sample every 6-12 months. °SEEK MEDICAL CARE IF: °You experience pain that is progressive and unresponsive to any pain medicine you have been prescribed. °SEEK IMMEDIATE MEDICAL CARE IF:  °Pain cannot be controlled with the prescribed medicine. °You have a  fever or shaking chills. °The severity or intensity of pain increases over 18 hours and is not relieved by pain medicine. °You develop a new onset of abdominal pain. °You feel faint or pass out. °You are unable to urinate. °  °This information is not intended to replace advice given to you by your health care provider. Make sure you discuss any questions you have with your health care provider. °  °Document Released: 12/06/2005 Document Revised: 08/27/2015 Document Reviewed: 05/09/2013 °Elsevier Interactive Patient Education ©2016 [employer] ° ° °

## 2016-10-13 DIAGNOSIS — N201 Calculus of ureter: Secondary | ICD-10-CM | POA: Diagnosis not present

## 2016-10-15 ENCOUNTER — Other Ambulatory Visit: Payer: Self-pay | Admitting: Urology

## 2016-10-16 DIAGNOSIS — K5900 Constipation, unspecified: Secondary | ICD-10-CM | POA: Diagnosis not present

## 2016-10-16 DIAGNOSIS — K649 Unspecified hemorrhoids: Secondary | ICD-10-CM | POA: Diagnosis not present

## 2016-10-18 ENCOUNTER — Encounter (HOSPITAL_COMMUNITY): Payer: Self-pay | Admitting: *Deleted

## 2016-10-18 NOTE — Progress Notes (Addendum)
Pre ESWL call:.Spoke to patient via phone,history obtained,updated.  Bring blue folder,insurance cards,picture ID,designated driver Reinforced no aspirin ibuprofen aleve products 72 hours prior to procedure.Follow laxative instructions provided by urologist (office) and in blue folder.Leave all valuables at home.  NPO past MN and follow laxative instructions in blue folder. Pt states he has sleep apnea and had not used his C-PAP in over a year and it is not cleaned and that he [facility] ap[facility]. Pt verbalized understanding.

## 2016-10-20 NOTE — H&P (Signed)
Office Visit Report     10/13/2016   --------------------------------------------------------------------------------   Geoffrey Castro  MRN: [id number]  PRIMARY CARE:  Lucianne Lei, MD  DOB: 10-28-1951, 65 year old Male  REFERRING:    SSN: [id number]  PROVIDER:  Louis Meckel, M.D.    TREATING:  Jiles Crocker    LOCATION:  [facility] [phone]   --------------------------------------------------------------------------------   CC: New Patient - Acute Kidney Stone  HPI: Geoffrey Castro is a 65 year-old male established patient who is here for further eval and management of kidney stones.  Pt presents in moderate distress. Percocet made him itch so he has not had much in the way of pain medication. Denies fevers. He has an obstructing stone in the left proximal ureter. Creatinine is 1.42 up from baseline below 1. He has not had a bowel movement since Saturday. It is not responsive to colace or miralax. Denies voiding problems.   The patient presents today for follow-up after being seen for renal colic on [id number] [facility] at 10/23 [facility]. The pain is on the left side. The patient relates initially having nausea, vomiting, flank pain, and voiding symptoms. He is currently having flank pain and back pain. He denies having groin pain, nausea, vomiting, fever, and chills. He has been taking Oxycodone, colace, flomax, miralax. This is not his first kidney stone. He has not caught a stone in his urine strainer since his symptoms began.   He has had ESWL, Ureteral Stent, and Ureteroscopy for treatment of his stones in the past.     ALLERGIES: oxycodone - Itching    MEDICATIONS: AmLODIPine Besylate 10 MG Oral Tablet Oral  Colchicine 0.6 MG Oral Tablet Oral  Januvia 100 MG Oral Tablet Oral  Micardis 40 MG Oral Tablet Oral  Omeprazole 20 MG Oral Capsule Delayed Release Oral  Viagra 100 MG Oral Tablet 0 Oral     GU PSH: Renal ESWL - 2008      PSH Notes: Lithotripsy,  Repair Of Tibial Nonunion, Repair Of Ulnar Nonunion   NON-GU PSH: Repair Radius Or Ulna - 2008 Repair Tibia - 2008    GU PMH: BPH w/LUTS, Benign localized prostatic hyperplasia with lower urinary tract symptoms (LUTS) - 03/11/2016 ED, arterial insufficiency, Erectile dysfunction due to arterial insufficiency - 2016 Nocturia, Nocturia - 2016 Kidney Stone, Nephrolithiasis - 2014 Personal Hx urinary calculi, Nephrolithiasis - 2014      PMH Notes:  2007-04-04 16:39:38 - Note: Arthritis   NON-GU PMH: Encounter for general adult medical examination without abnormal findings, Encounter for preventive health examination - 03/11/2016 Anxiety, Anxiety - 2014, Anxiety, - 2014 Arthritis, Arthritis - 2014 Depression, Depression - 2014 Gout, Gout - 2014, Gout, - 2014 Heartburn, Heartburn - 2014 Personal history of other diseases of the circulatory system, History of hypertension - 2014 Personal history of other diseases of the digestive system, History of esophageal reflux - 2014 Personal history of other endocrine, nutritional and metabolic disease, History of diabetes mellitus - 2014 Personal history of other mental and behavioral disorders, History of depression - 2014 Sebaceous cyst, Sebaceous cyst - 2014    FAMILY HISTORY: Family Health Status - Father's Age - Runs In Family Family Health Status - Mother's Age - Runs In Family Family Health Status Number - Runs In Family No pertinent family history - Other Prostate Cancer - Runs In Family   SOCIAL HISTORY: Marital Status: Married 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     Notes: Current every day smoker, Occupation:,  Tobacco Use, Caffeine Use, Alcohol Use, Marital History - Currently Married   REVIEW OF SYSTEMS:    GU Review Male:   Patient denies frequent urination, hard to postpone urination, burning/ pain with urination, get up at night to urinate, leakage of urine, stream starts and stops, trouble starting your stream, have to  strain to urinate , erection problems, and penile pain.  Gastrointestinal (Upper):   Patient reports nausea. Patient denies vomiting and indigestion/ heartburn.  Gastrointestinal (Lower):   Patient reports constipation. Patient denies diarrhea.  Constitutional:   Patient denies fever, night sweats, weight loss, and fatigue.  Skin:   Patient denies skin rash/ lesion and itching.  Eyes:   Patient denies blurred vision and double vision.  Ears/ Nose/ Throat:   Patient denies sinus problems and sore throat.  Hematologic/Lymphatic:   Patient denies swollen glands and easy bruising.  Cardiovascular:   Patient denies leg swelling and chest pains.  Respiratory:   Patient denies cough and shortness of breath.  Endocrine:   Patient denies excessive thirst.  Musculoskeletal:   Patient reports back pain. Patient denies joint pain.  Neurological:   Patient denies headaches and dizziness.  Psychologic:   Patient denies depression and anxiety.   VITAL SIGNS:      10/13/2016 08:28 AM  Weight 240 lb / 108.86 kg  Height 69 in / 175.26 cm  BP 127/84 mmHg  Pulse 81 /min  Temperature 98.6 F / 37 C  BMI 35.4 kg/m   MULTI-SYSTEM PHYSICAL EXAMINATION:    Constitutional: Well-nourished. No physical deformities. Normally developed. Good grooming.  Respiratory: No labored breathing, no use of accessory muscles. RRR.  Cardiovascular: Normal temperature, normal extremity pulses, no swelling, no varicosities. S1/s2 auscultated. RRR.  Skin: No paleness, no jaundice, no cyanosis. No lesion, no ulcer, no rash.  Neurologic / Psychiatric: Oriented to time, oriented to place, oriented to person. No depression, no anxiety, no agitation.  Gastrointestinal: Obese abdomen, some mild distension. No mass, no tenderness, no rigidity.   Musculoskeletal: Spine, ribs, pelvis no bilateral tenderness. Normal gait and station of head and neck.     PAST DATA REVIEWED:  Source Of History:  Patient   03/14/15 03/02/13 12/31/10  11/10/09 01/24/08 09/28/06 10/12/05 08/29/03  PSA  Total PSA 0.43  0.44  0.41  0.52  0.40  0.91  0.44  0.33     PROCEDURES:         KUB - 74000  A single view of the abdomen is obtained.      3.6 mm x 6 mm calculi noted in the left proximal ureter. Additional nonobstructing calculi are seen bilaterally within the confines of both renal shadows. Stable appearing pelvic phlebolith noted. It does look like he has a moderate stool burden based on my visualization.         Urinalysis w/Scope Dipstick Dipstick Cont'd Micro  Color: Amber Bilirubin: Neg WBC/hpf: 0 - 5/hpf  Appearance: Clear Ketones: Trace RBC/hpf: 0 - 2/hpf  Specific Gravity: 1.025 Blood: 1+ Bacteria: NS (Not Seen)  pH: 5.5 Protein: Trace Cystals: NS (Not Seen)  Glucose: Neg Urobilinogen: 0.2 Casts: NS (Not Seen)    Nitrites: Neg Trichomonas: Not Present    Leukocyte Esterase: 1+ Mucous: Not Present      Epithelial Cells: NS (Not Seen)      Yeast: NS (Not Seen)      Sperm: Not Present         Morphine 4mg  - J2270, [id number] Qty: 4 Adm. By: Robynn Pane  Unit: mg Lot No [id number]  Route: IM Exp. Date 12/20/2017  Freq: None Mfgr.:   Site: Left Buttock         Phenergan 25mg  - J2550, [id number] Qty: 25 Adm. By: Robynn Pane  Unit: mg Lot No [id number]  Route: IM Exp. Date 11/19/2017  Freq: None Mfgr.:   Site: Left Buttock   ASSESSMENT:      ICD-10 Details  1 GU:   Calculus Ureter - N20.1 Left   PLAN:            Medications New Meds: Hydrocodone-Acetaminophen 5 mg-325 mg tablet 1 tablet PO Q 4 H PRN   #20  0 Refill(s)            Orders Labs Urine Culture and Sensitivity  X-Rays: KUB  X-Ray Notes: ...          Schedule Procedure: 10/13/2016 at [facility] - [phone] - Morphine 4mg  (Ther/Proph/Diag Inj, /Im) [id number], [id number]  Procedure: 10/13/2016 at [facility] - [phone] - Phenergan 25mg  (Phenergan Per 50 Mg) - [id number], [id number]          Document Letter(s):  Created for  Patient: Clinical Summary         Notes:   He is underwent lithotripsy before. I revisited the risks and benefits of that procedure with him. Questions answered and understanding expressed. Once he is clear of this current obstructive stone pattern, I think it best to pursue metabolic evaluation with him noting his bilateral stone burden. I provided a prescription of hydrocodone since there was some itching with oxycodone. He will continue on tamsulosin as well. I recommended he limited. He is in favor of Aleve or Advill to prevent further constipation. He will continue MiraLAX and Colace. I also recommended using a fleets enema since it has been about 5 days since his last bowel movement. Further follow up instructions given for worsening symptoms. I'll discuss his case with his urologist.    * Signed by Jiles Crocker on 10/13/16 at 9:41 AM (EDT)*

## 2016-10-21 ENCOUNTER — Encounter (HOSPITAL_COMMUNITY)
Admission: RE | Disposition: A | Discharge status: Home / Self Care | Payer: Self-pay | Source: Ambulatory Visit | Attending: Urology

## 2016-10-21 ENCOUNTER — Ambulatory Visit (HOSPITAL_COMMUNITY)
Admission: RE | Admit: 2016-10-21 | Discharge: 2016-10-21 | Disposition: A | Discharge status: Home / Self Care | Payer: Medicare Other | Source: Ambulatory Visit | Attending: Urology | Admitting: Urology

## 2016-10-21 ENCOUNTER — Ambulatory Visit (HOSPITAL_COMMUNITY): Payer: Medicare Other | Admitting: Anesthesiology

## 2016-10-21 ENCOUNTER — Ambulatory Visit: Admit: 2016-10-21 | Payer: Medicare Other | Admitting: Urology

## 2016-10-21 ENCOUNTER — Ambulatory Visit (HOSPITAL_COMMUNITY): Payer: Medicare Other

## 2016-10-21 ENCOUNTER — Encounter (HOSPITAL_COMMUNITY): Payer: Self-pay | Admitting: General Practice

## 2016-10-21 DIAGNOSIS — F329 Major depressive disorder, single episode, unspecified: Secondary | ICD-10-CM | POA: Diagnosis not present

## 2016-10-21 DIAGNOSIS — Z87442 Personal history of urinary calculi: Secondary | ICD-10-CM | POA: Diagnosis not present

## 2016-10-21 DIAGNOSIS — Z79899 Other long term (current) drug therapy: Secondary | ICD-10-CM | POA: Diagnosis not present

## 2016-10-21 DIAGNOSIS — F17200 Nicotine dependence, unspecified, uncomplicated: Secondary | ICD-10-CM | POA: Insufficient documentation

## 2016-10-21 DIAGNOSIS — F419 Anxiety disorder, unspecified: Secondary | ICD-10-CM | POA: Insufficient documentation

## 2016-10-21 DIAGNOSIS — E119 Type 2 diabetes mellitus without complications: Secondary | ICD-10-CM | POA: Diagnosis not present

## 2016-10-21 DIAGNOSIS — N201 Calculus of ureter: Secondary | ICD-10-CM | POA: Diagnosis not present

## 2016-10-21 DIAGNOSIS — I10 Essential (primary) hypertension: Secondary | ICD-10-CM | POA: Insufficient documentation

## 2016-10-21 DIAGNOSIS — K219 Gastro-esophageal reflux disease without esophagitis: Secondary | ICD-10-CM | POA: Insufficient documentation

## 2016-10-21 DIAGNOSIS — N202 Calculus of kidney with calculus of ureter: Secondary | ICD-10-CM | POA: Diagnosis not present

## 2016-10-21 DIAGNOSIS — M1990 Unspecified osteoarthritis, unspecified site: Secondary | ICD-10-CM | POA: Diagnosis not present

## 2016-10-21 DIAGNOSIS — Z711 Person with feared health complaint in whom no diagnosis is made: Secondary | ICD-10-CM | POA: Diagnosis not present

## 2016-10-21 HISTORY — DX: Personal history of urinary calculi: Z87.442

## 2016-10-21 HISTORY — PX: CYSTOSCOPY W/ URETERAL STENT PLACEMENT: SHX1429

## 2016-10-21 LAB — GLUCOSE, CAPILLARY
Glucose-Capillary: 117 mg/dL — ABNORMAL HIGH (ref 65–99)
Glucose-Capillary: 107 mg/dL — ABNORMAL HIGH (ref 65–99)

## 2016-10-21 SURGERY — CYSTOSCOPY, WITH RETROGRADE PYELOGRAM AND URETERAL STENT INSERTION
Anesthesia: General | Laterality: Left

## 2016-10-21 SURGERY — LITHOTRIPSY, ESWL
Anesthesia: LOCAL | Laterality: Left

## 2016-10-21 MED ORDER — CIPROFLOXACIN HCL 500 MG PO TABS
500.0000 mg | ORAL_TABLET | ORAL | Status: AC
  Administered 2016-10-21: 500 mg via ORAL
  Filled 2016-10-21: qty 1

## 2016-10-21 MED ORDER — PROPOFOL 10 MG/ML IV BOLUS
INTRAVENOUS | Status: AC
  Filled 2016-10-21: qty 40

## 2016-10-21 MED ORDER — MIDAZOLAM HCL 5 MG/5ML IJ SOLN
INTRAMUSCULAR | Status: DC | PRN
  Administered 2016-10-21 (×2): 1 mg via INTRAVENOUS

## 2016-10-21 MED ORDER — ONDANSETRON HCL 4 MG/2ML IJ SOLN
INTRAMUSCULAR | Status: DC | PRN
  Administered 2016-10-21: 4 mg via INTRAVENOUS

## 2016-10-21 MED ORDER — LACTATED RINGERS IV SOLN
INTRAVENOUS | Status: DC
  Administered 2016-10-21: 1000 mL via INTRAVENOUS

## 2016-10-21 MED ORDER — METOCLOPRAMIDE HCL 5 MG/ML IJ SOLN: 10.0000 mg | Freq: Once | INTRAMUSCULAR | Status: DC | PRN

## 2016-10-21 MED ORDER — SODIUM CHLORIDE 0.9 % IV SOLN
INTRAVENOUS | Status: DC
  Administered 2016-10-21: 08:00:00 via INTRAVENOUS

## 2016-10-21 MED ORDER — IOHEXOL 300 MG/ML  SOLN
INTRAMUSCULAR | Status: DC | PRN
  Administered 2016-10-21: 2 mL

## 2016-10-21 MED ORDER — DIAZEPAM 5 MG PO TABS
10.0000 mg | ORAL_TABLET | ORAL | Status: AC
  Administered 2016-10-21: 10 mg via ORAL
  Filled 2016-10-21: qty 2

## 2016-10-21 MED ORDER — FENTANYL CITRATE (PF) 100 MCG/2ML IJ SOLN
INTRAMUSCULAR | Status: DC | PRN
  Administered 2016-10-21 (×4): 25 ug via INTRAVENOUS

## 2016-10-21 MED ORDER — MIDAZOLAM HCL 2 MG/2ML IJ SOLN
INTRAMUSCULAR | Status: AC
  Filled 2016-10-21: qty 2

## 2016-10-21 MED ORDER — FENTANYL CITRATE (PF) 100 MCG/2ML IJ SOLN
INTRAMUSCULAR | Status: AC
  Filled 2016-10-21: qty 2

## 2016-10-21 MED ORDER — LIDOCAINE 2% (20 MG/ML) 5 ML SYRINGE
INTRAMUSCULAR | Status: DC | PRN
  Administered 2016-10-21: 100 mg via INTRAVENOUS

## 2016-10-21 MED ORDER — PROPOFOL 10 MG/ML IV BOLUS
INTRAVENOUS | Status: DC | PRN
  Administered 2016-10-21: 180 mg via INTRAVENOUS

## 2016-10-21 MED ORDER — FENTANYL CITRATE (PF) 100 MCG/2ML IJ SOLN: 25.0000 ug | INTRAMUSCULAR | Status: DC | PRN

## 2016-10-21 MED ORDER — SODIUM CHLORIDE 0.9 % IR SOLN
Status: DC | PRN
  Administered 2016-10-21: 4000 mL

## 2016-10-21 MED ORDER — LIDOCAINE 2% (20 MG/ML) 5 ML SYRINGE
INTRAMUSCULAR | Status: AC
  Filled 2016-10-21: qty 5

## 2016-10-21 MED ORDER — LACTATED RINGERS IV SOLN: INTRAVENOUS | Status: DC

## 2016-10-21 MED ORDER — DIPHENHYDRAMINE HCL 25 MG PO CAPS
25.0000 mg | ORAL_CAPSULE | ORAL | Status: AC
  Administered 2016-10-21: 25 mg via ORAL
  Filled 2016-10-21: qty 1

## 2016-10-21 MED ORDER — CEFAZOLIN SODIUM-DEXTROSE 2-4 GM/100ML-% IV SOLN
2.0000 g | Freq: Once | INTRAVENOUS | Status: AC
  Administered 2016-10-21: 2 g via INTRAVENOUS

## 2016-10-21 MED ORDER — MEPERIDINE HCL 50 MG/ML IJ SOLN: 6.2500 mg | INTRAMUSCULAR | Status: DC | PRN

## 2016-10-21 MED ORDER — CEFAZOLIN SODIUM-DEXTROSE 2-4 GM/100ML-% IV SOLN
INTRAVENOUS | Status: AC
  Filled 2016-10-21: qty 100

## 2016-10-21 SURGICAL SUPPLY — 9 items
BAG URO CATCHER STRL LF (MISCELLANEOUS) ×2 IMPLANT
CATH INTERMIT  6FR 70CM (CATHETERS) ×2 IMPLANT
CLOTH BEACON ORANGE TIMEOUT ST (SAFETY) ×2 IMPLANT
GLOVE BIOGEL M STRL SZ7.5 (GLOVE) ×2 IMPLANT
GOWN STRL REUS W/TWL LRG LVL3 (GOWN DISPOSABLE) ×4 IMPLANT
GUIDEWIRE STR DUAL SENSOR (WIRE) ×2 IMPLANT
MANIFOLD NEPTUNE II (INSTRUMENTS) ×2 IMPLANT
PACK CYSTO (CUSTOM PROCEDURE TRAY) ×2 IMPLANT
TUBING CONNECTING 10 (TUBING) ×2 IMPLANT

## 2016-10-21 NOTE — Anesthesia Procedure Notes (Signed)
Procedure Name: LMA Insertion Date/Time: 10/21/2016 12:25 PM Performed by: Justice Rocher Pre-anesthesia Checklist: Patient identified, Emergency Drugs available, Suction available and Patient being monitored Patient Re-evaluated:Patient Re-evaluated prior to inductionOxygen Delivery Method: Circle system utilized Preoxygenation: Pre-oxygenation with 100% oxygen Intubation Type: IV induction Ventilation: Mask ventilation without difficulty LMA: LMA inserted LMA Size: 5.0 Number of attempts: 1 Airway Equipment and Method: Bite block Placement Confirmation: positive ETCO2 Tube secured with: Tape Dental Injury: Teeth and Oropharynx as per pre-operative assessment

## 2016-10-21 NOTE — Op Note (Addendum)
Pt originally scheduled for ESWL of proximal left ureteral stone.  Stone could not be well visualized.  ESWL cancelled and patient to undergo ureteroscopic treatment.

## 2016-10-21 NOTE — Anesthesia Procedure Notes (Deleted)
Performed by: Sherrica Niehaus C       

## 2016-10-21 NOTE — Anesthesia Preprocedure Evaluation (Signed)
Anesthesia Evaluation  Patient identified by MRN, date of birth, ID band Patient awake    Reviewed: Allergy & Precautions, NPO status , Patient's Chart, lab work & pertinent test results  Airway Mallampati: II  TM Distance: >3 FB Neck ROM: Full    Dental no notable dental hx.    Pulmonary neg pulmonary ROS, Current Smoker,    Pulmonary exam normal breath sounds clear to auscultation       Cardiovascular hypertension, Pt. on medications negative cardio ROS Normal cardiovascular exam Rhythm:Regular Rate:Normal     Neuro/Psych negative neurological ROS  negative psych ROS   GI/Hepatic negative GI ROS, Neg liver ROS,   Endo/Other  negative endocrine ROSdiabetes, Type 2  Renal/GU negative Renal ROS  negative genitourinary   Musculoskeletal negative musculoskeletal ROS (+)   Abdominal   Peds negative pediatric ROS (+)  Hematology negative hematology ROS (+)   Anesthesia Other Findings   Reproductive/Obstetrics negative OB ROS                             Anesthesia Physical Anesthesia Plan  ASA: II  Anesthesia Plan: General   Post-op Pain Management:    Induction: Intravenous  Airway Management Planned: LMA  Additional Equipment:   Intra-op Plan:   Post-operative Plan: Extubation in OR  Informed Consent: I have reviewed the patients History and Physical, chart, labs and discussed the procedure including the risks, benefits and alternatives for the proposed anesthesia with the patient or authorized representative who has indicated his/her understanding and acceptance.   Dental advisory given  Plan Discussed with: CRNA  Anesthesia Plan Comments:         Anesthesia Quick Evaluation

## 2016-10-21 NOTE — Interval H&P Note (Signed)
History and Physical Interval Note:  10/21/2016 8:20 AM  Geoffrey Castro  has presented today for surgery, with the diagnosis of LEFT URETERAL STONE  The various methods of treatment have been discussed with the patient and family. After consideration of risks, benefits and other options for treatment, the patient has consented to  Procedure(s): LEFT EXTRACORPOREAL SHOCK WAVE LITHOTRIPSY (ESWL) (Left) as a surgical intervention .  The patient's history has been reviewed, patient examined, no change in status, stable for surgery.  I have reviewed the patient's chart and labs.  Questions were answered to the patient's satisfaction.     Shaylea Ucci,LES

## 2016-10-21 NOTE — Op Note (Signed)
Preoperative diagnosis: Left ureteral calculus  Postoperative diagnosis: History of left ureteral calculus  Procedure:  1. Cystoscopy 2. Left ureteroscopy 3. Left retrograde pyelography with interpretation  Surgeon: Pryor Curia. M.D.  Anesthesia: General  Complications: None  Intraoperative findings: Left retrograde pyelography demonstrated a questionable filling defect in the mid ureter consistent with the patient's known calculus without other abnormalities.  EBL: Minimal   Indication: Geoffrey Castro is a 65 y.o. year old patient with urolithiasis. He was found to have a left ureteral calculus and was originally scheduled for ESWL.  However, his imaging the day of his ESWL demonstrated that his stone had migrated and was no longer visible.  He was adamant that he had not passed his stone and we discussed options and agreed to proceed with cystoscopy and retrograde pyelography with possible ureteroscopic treatment. After reviewing the management options for treatment, the patient elected to proceed with the above surgical procedure(s). We have discussed the potential benefits and risks of the procedure, side effects of the proposed treatment, the likelihood of the patient achieving the goals of the procedure, and any potential problems that might occur during the procedure or recuperation. Informed consent has been obtained.  Description of procedure:  The patient was taken to the operating room and general anesthesia was induced.  The patient was placed in the dorsal lithotomy position, prepped and draped in the usual sterile fashion, and preoperative antibiotics were administered. A preoperative time-out was performed.   Cystourethroscopy was performed.  The patient's urethra was examined and was normal/ demonstrated bilobar prostatic hypertrophy with a median intravesical lobe. The bladder was then systematically examined in its entirety. There was no evidence for any  bladder tumors, stones, or other mucosal pathology.    Attention then turned to the left ureteral orifice and a ureteral catheter was used to intubate the ureteral orifice.  Omnipaque contrast was injected through the ureteral catheter and a retrograde pyelogram was performed with findings as dictated above.  There was a questionable filling defect in the mid left ureter.  No proximal dilation of the ureter was noted.  A 0.38 sensor guidewire was then advanced up the left ureter into the renal pelvis under fluoroscopic guidance. The 6 Fr semirigid ureteroscope was then advanced into the ureter next to the guidewire.  I examined the ureter up to the proximal ureter with no calculus identified.  The wire was removed.  The bladder was then emptied and the procedure ended.  The patient appeared to tolerate the procedure well and without complications.  The patient was able to be awakened and transferred to the recovery unit in satisfactory condition.

## 2016-10-21 NOTE — Interval H&P Note (Signed)
History and Physical Interval Note:  10/21/2016 9:41 AM  Geoffrey Castro  has presented today for surgery, with the diagnosis of stone  The various methods of treatment have been discussed with the patient and family. After consideration of risks, benefits and other options for treatment, the patient has consented to  Procedure(s): CYSTOSCOPY WITH RETROGRADE LEFT URETEROSCOPY PYELOGRAM/ AND POSSIBLE URETERAL STENT PLACEMENT (Left) as a surgical intervention .  The patient's history has been reviewed, patient examined, no change in status, stable for surgery.  I have reviewed the patient's chart and labs.  Questions were answered to the patient's satisfaction.     Geoffrey Castro,LES

## 2016-10-21 NOTE — Anesthesia Postprocedure Evaluation (Signed)
Anesthesia Post Note  Patient: Geoffrey Castro  Procedure(s) Performed: Procedure(s) (LRB): CYSTOSCOPY WITH LEFT RETROGRADE PYELOGRAM,LEFT URETEROSCOPY (Left)  Patient location during evaluation: PACU Anesthesia Type: General Level of consciousness: awake and alert Pain management: pain level controlled Vital Signs Assessment: post-procedure vital signs reviewed and stable Respiratory status: spontaneous breathing, nonlabored ventilation, respiratory function stable and patient connected to nasal cannula oxygen Cardiovascular status: blood pressure returned to baseline and stable Postop Assessment: no signs of nausea or vomiting Anesthetic complications: no    Last Vitals:  Vitals:   10/21/16 1345 10/21/16 1407  BP: 100/72 104/68  Pulse:  63  Resp: 12 16  Temp:      Last Pain:  Vitals:   10/21/16 1407  TempSrc:   PainSc: 0-No pain                 [name]

## 2016-10-21 NOTE — Transfer of Care (Signed)
Immediate Anesthesia Transfer of Care Note  Patient: Geoffrey Castro  Procedure(s) Performed: Procedure(s) (LRB): CYSTOSCOPY WITH LEFT RETROGRADE PYELOGRAM,LEFT URETEROSCOPY (Left)  Patient Location: PACU  Anesthesia Type: General  Level of Consciousness: awake, sedated, patient cooperative and responds to stimulation  Airway & Oxygen Therapy: Patient Spontanous Breathing and Patient connected to face mask oxygen  Post-op Assessment: Report given to PACU RN, Post -op Vital signs reviewed and stable and Patient moving all extremities  Post vital signs: Reviewed and stable  Complications: No apparent anesthesia complications

## 2016-10-21 NOTE — Discharge Instructions (Signed)
1. You may see some blood in the urine and may have some burning with urination for 48-72 hours. You also may notice that you have to urinate more frequently or urgently after your procedure which is normal.  °2. You should call should you develop an inability urinate, fever > 101, persistent nausea and vomiting that prevents you from eating or drinking to stay hydrated.  °

## 2016-11-04 DIAGNOSIS — N200 Calculus of kidney: Secondary | ICD-10-CM | POA: Diagnosis not present

## 2016-11-09 ENCOUNTER — Ambulatory Visit (INDEPENDENT_AMBULATORY_CARE_PROVIDER_SITE_OTHER): Payer: Medicare Other | Admitting: Podiatry

## 2016-11-09 DIAGNOSIS — Q828 Other specified congenital malformations of skin: Secondary | ICD-10-CM | POA: Diagnosis not present

## 2016-11-09 DIAGNOSIS — M79676 Pain in unspecified toe(s): Secondary | ICD-10-CM

## 2016-11-09 DIAGNOSIS — B351 Tinea unguium: Secondary | ICD-10-CM | POA: Diagnosis not present

## 2016-11-09 NOTE — Progress Notes (Signed)
   Subjective:    Patient ID: Geoffrey Castro, male    DOB: 04/18/1951, 65 y.o.   MRN: [id number]  HPI this patient presents to the office saying that his nails have grown long and thick. He states that the nails are painful walking and wearing his shoes.  He also says he has a severely painful callus under the ball of his right foot. This callus is  painful walking and wearing his shoes. This patient is a diabetic and he presents the office today for a diabetic foot exam as well as treatment of his nails and callus    Review of Systems  Psychiatric/Behavioral:       Anxiety       Objective:   Physical Exam GENERAL APPEARANCE: Alert, conversant. Appropriately groomed. No acute distress.  VASCULAR: Pedal pulses are  palpable at  [facility] and PT bilateral.  Capillary refill time is immediate to all digits,  Normal temperature gradient.  Digital hair growth is present bilateral  NEUROLOGIC: sensation is normal to 5.07 monofilament at 5/5 sites bilateral.  Light touch is intact bilateral, Muscle strength normal.  MUSCULOSKELETAL: acceptable muscle strength, tone and stability bilateral.  Intrinsic muscluature intact bilateral.  Rectus appearance of foot and digits noted bilateral.   DERMATOLOGIC: skin color, texture, and turgor are within normal limits.  No preulcerative lesions or ulcers  are seen, no interdigital maceration noted.  No open lesions present.  . No drainage noted.  Porokeratosis sub 1 right foot.  NAILS  Thick disfigured discolored nails both feet.         Assessment & Plan:  Onychomycosis  Porokeratosis right foot  IE  Debridement of nails.  Debridement of porokeratosis    RTC 3 months.  Diabetic foot exam.  Gardiner Barefoot DPM

## 2016-11-17 DIAGNOSIS — E789 Disorder of lipoprotein metabolism, unspecified: Secondary | ICD-10-CM | POA: Diagnosis not present

## 2016-11-17 DIAGNOSIS — E118 Type 2 diabetes mellitus with unspecified complications: Secondary | ICD-10-CM | POA: Diagnosis not present

## 2016-11-17 DIAGNOSIS — K5900 Constipation, unspecified: Secondary | ICD-10-CM | POA: Diagnosis not present

## 2016-11-17 DIAGNOSIS — I10 Essential (primary) hypertension: Secondary | ICD-10-CM | POA: Diagnosis not present

## 2016-11-17 DIAGNOSIS — E786 Lipoprotein deficiency: Secondary | ICD-10-CM | POA: Diagnosis not present

## 2016-11-17 DIAGNOSIS — Z125 Encounter for screening for malignant neoplasm of prostate: Secondary | ICD-10-CM | POA: Diagnosis not present

## 2016-11-17 DIAGNOSIS — E089 Diabetes mellitus due to underlying condition without complications: Secondary | ICD-10-CM | POA: Diagnosis not present

## 2016-12-22 DIAGNOSIS — H2513 Age-related nuclear cataract, bilateral: Secondary | ICD-10-CM | POA: Diagnosis not present

## 2016-12-22 DIAGNOSIS — H40013 Open angle with borderline findings, low risk, bilateral: Secondary | ICD-10-CM | POA: Diagnosis not present

## 2016-12-22 DIAGNOSIS — H40033 Anatomical narrow angle, bilateral: Secondary | ICD-10-CM | POA: Diagnosis not present

## 2016-12-22 DIAGNOSIS — E119 Type 2 diabetes mellitus without complications: Secondary | ICD-10-CM | POA: Diagnosis not present

## 2016-12-22 DIAGNOSIS — H25013 Cortical age-related cataract, bilateral: Secondary | ICD-10-CM | POA: Diagnosis not present

## 2017-01-26 DIAGNOSIS — H60502 Unspecified acute noninfective otitis externa, left ear: Secondary | ICD-10-CM | POA: Diagnosis not present

## 2017-02-02 DIAGNOSIS — I10 Essential (primary) hypertension: Secondary | ICD-10-CM | POA: Diagnosis not present

## 2017-02-02 DIAGNOSIS — H60392 Other infective otitis externa, left ear: Secondary | ICD-10-CM | POA: Diagnosis not present

## 2017-02-02 DIAGNOSIS — Z0000 Encounter for general adult medical examination without abnormal findings: Secondary | ICD-10-CM | POA: Diagnosis not present

## 2017-02-08 ENCOUNTER — Encounter: Payer: Self-pay | Admitting: Podiatry

## 2017-02-08 ENCOUNTER — Ambulatory Visit (INDEPENDENT_AMBULATORY_CARE_PROVIDER_SITE_OTHER): Payer: Medicare Other | Admitting: Podiatry

## 2017-02-08 VITALS — Ht 69.0 in | Wt 235.0 lb

## 2017-02-08 DIAGNOSIS — B351 Tinea unguium: Secondary | ICD-10-CM

## 2017-02-08 DIAGNOSIS — Q828 Other specified congenital malformations of skin: Secondary | ICD-10-CM | POA: Diagnosis not present

## 2017-02-08 DIAGNOSIS — M79676 Pain in unspecified toe(s): Secondary | ICD-10-CM | POA: Diagnosis not present

## 2017-02-08 NOTE — Progress Notes (Signed)
   Subjective:    Patient ID: Geoffrey Castro, male    DOB: 10/26/1951, 66 y.o.   MRN: [id number]  HPI this patient presents to the office saying that his nails have grown long and thick. He states that the nails are painful walking and wearing his shoes.  He also says he has a severely painful callus under the ball of his right foot. This callus is  painful walking and wearing his shoes. This patient is a diabetic and he presents the office today for a diabetic foot exam as well as treatment of his nails and callus    Review of Systems  Psychiatric/Behavioral:       Anxiety       Objective:   Physical Exam GENERAL APPEARANCE: Alert, conversant. Appropriately groomed. No acute distress.  VASCULAR: Pedal pulses are  palpable at  [facility] and PT bilateral.  Capillary refill time is immediate to all digits,  Normal temperature gradient.  Digital hair growth is present bilateral  NEUROLOGIC: sensation is normal to 5.07 monofilament at 5/5 sites bilateral.  Light touch is intact bilateral, Muscle strength normal.  MUSCULOSKELETAL: acceptable muscle strength, tone and stability bilateral.  Intrinsic muscluature intact bilateral.  Rectus appearance of foot and digits noted bilateral.   DERMATOLOGIC: skin color, texture, and turgor are within normal limits.  No preulcerative lesions or ulcers  are seen, no interdigital maceration noted.  No open lesions present.  . No drainage noted.  Porokeratosis sub 1 right foot.  NAILS  Thick disfigured discolored nails both feet.         Assessment & Plan:  Onychomycosis  Porokeratosis right foot  IE  Debridement of nails.  Debridement of porokeratosis    RTC 3 months.  Diabetic foot exam.  Dayleen Beske DPM   

## 2017-05-10 ENCOUNTER — Ambulatory Visit (INDEPENDENT_AMBULATORY_CARE_PROVIDER_SITE_OTHER): Payer: Medicare Other | Admitting: Podiatry

## 2017-05-10 ENCOUNTER — Encounter: Payer: Self-pay | Admitting: Podiatry

## 2017-05-10 DIAGNOSIS — B351 Tinea unguium: Secondary | ICD-10-CM | POA: Diagnosis not present

## 2017-05-10 DIAGNOSIS — M79676 Pain in unspecified toe(s): Secondary | ICD-10-CM | POA: Diagnosis not present

## 2017-05-10 DIAGNOSIS — Q828 Other specified congenital malformations of skin: Secondary | ICD-10-CM

## 2017-05-10 NOTE — Progress Notes (Signed)
   Subjective:    Patient ID: Geoffrey Castro, male    DOB: 02-15-1951, 66 y.o.   MRN: [id number]  HPI this patient presents to the office saying that his nails have grown long and thick. He states that the nails are painful walking and wearing his shoes.  He also says he has a severely painful callus under the ball of his right foot. This callus is  painful walking and wearing his shoes. This patient is a diabetic and he presents the office today for a diabetic foot exam as well as treatment of his nails and callus    Review of Systems  Psychiatric/Behavioral:       Anxiety       Objective:   Physical Exam GENERAL APPEARANCE: Alert, conversant. Appropriately groomed. No acute distress.  VASCULAR: Pedal pulses are  palpable at  [facility] and PT bilateral.  Capillary refill time is immediate to all digits,  Normal temperature gradient.  Digital hair growth is present bilateral  NEUROLOGIC: sensation is normal to 5.07 monofilament at 5/5 sites bilateral.  Light touch is intact bilateral, Muscle strength normal.  MUSCULOSKELETAL: acceptable muscle strength, tone and stability bilateral.  Intrinsic muscluature intact bilateral.  Rectus appearance of foot and digits noted bilateral.   DERMATOLOGIC: skin color, texture, and turgor are within normal limits.  No preulcerative lesions or ulcers  are seen, no interdigital maceration noted.  No open lesions present.  . No drainage noted.  Porokeratosis sub 1 right foot.  NAILS  Thick disfigured discolored nails both feet.         Assessment & Plan:  Onychomycosis  Porokeratosis right foot  IE  Debridement of nails.  Debridement of porokeratosis    RTC 3 months.  Diabetic foot exam.  Gardiner Barefoot DPM

## 2017-07-19 DIAGNOSIS — N400 Enlarged prostate without lower urinary tract symptoms: Secondary | ICD-10-CM | POA: Diagnosis not present

## 2017-07-19 DIAGNOSIS — N401 Enlarged prostate with lower urinary tract symptoms: Secondary | ICD-10-CM | POA: Diagnosis not present

## 2017-07-26 ENCOUNTER — Ambulatory Visit (INDEPENDENT_AMBULATORY_CARE_PROVIDER_SITE_OTHER): Payer: Medicare Other | Admitting: Podiatry

## 2017-07-26 ENCOUNTER — Encounter: Payer: Self-pay | Admitting: Podiatry

## 2017-07-26 DIAGNOSIS — Q828 Other specified congenital malformations of skin: Secondary | ICD-10-CM | POA: Diagnosis not present

## 2017-07-26 DIAGNOSIS — E119 Type 2 diabetes mellitus without complications: Secondary | ICD-10-CM

## 2017-07-26 DIAGNOSIS — B351 Tinea unguium: Secondary | ICD-10-CM | POA: Diagnosis not present

## 2017-07-26 DIAGNOSIS — M79676 Pain in unspecified toe(s): Secondary | ICD-10-CM | POA: Diagnosis not present

## 2017-07-26 NOTE — Progress Notes (Signed)
Complaint:  Visit Type: Patient returns to my office for continued preventative foot care services. Complaint: Patient states" my nails have grown long and thick and become painful to walk and wear shoes" Patient has been diagnosed with DM with no foot complications. The patient presents for preventative foot care services. No changes to ROS.  Painful callus right foot.  Podiatric Exam: Vascular: dorsalis pedis and posterior tibial pulses are palpable bilateral. Capillary return is immediate. Temperature gradient is WNL. Skin turgor WNL  Sensorium: Normal Semmes Weinstein monofilament test. Normal tactile sensation bilaterally. Nail Exam: Pt has thick disfigured discolored nails with subungual debris noted bilateral entire nail hallux through fifth toenails Ulcer Exam: There is no evidence of ulcer or pre-ulcerative changes or infection. Orthopedic Exam: Muscle tone and strength are WNL. No limitations in general ROM. No crepitus or effusions noted. Foot type and digits show no abnormalities. Bony prominences are unremarkable. Skin: No Porokeratosis. No infection or ulcers.  Porokeratosis right foot  Diagnosis:  Onychomycosis, , Pain in right toe, pain in left toes  Porokeratosis  Treatment & Plan Procedures and Treatment: Consent by patient was obtained for treatment procedures. The patient understood the discussion of treatment and procedures well. All questions were answered thoroughly reviewed. Debridement of mycotic and hypertrophic toenails, 1 through 5 bilateral and clearing of subungual debris. No ulceration, no infection noted. Debride porokeratosis Return Visit-Office Procedure: Patient instructed to return to the office for a follow up visit 3 months for continued evaluation and treatment.    Henry Utsey DPM 

## 2017-08-12 ENCOUNTER — Ambulatory Visit: Payer: Medicare Other | Admitting: Podiatry

## 2017-09-06 ENCOUNTER — Other Ambulatory Visit: Payer: Self-pay

## 2017-09-06 NOTE — Patient Outreach (Signed)
[facility]) Care Management  09/06/2017  Geoffrey Castro Nov 25, 1951 [id number]   Medication Adherence call to Geoffrey Castro the reason for this call is because Geoffrey Castro is showing past due under [employer] Ins.on Januvia 100 mg spoke to patient he said he has been taking 1 tablet every other day instead of 1 tablet a day, he thought that's how he was supposed to take it, patient read the bottle patient said that he was taking it wrong, he is going to start taking it the way it said on the bottle.he said he did not need any at this time he still has 15 more days left on the bottle.    [facility] Management Direct Dial [phone]  Fax [phone] [email or web address]

## 2017-10-12 DIAGNOSIS — I10 Essential (primary) hypertension: Secondary | ICD-10-CM | POA: Diagnosis not present

## 2017-10-12 DIAGNOSIS — M1009 Idiopathic gout, multiple sites: Secondary | ICD-10-CM | POA: Diagnosis not present

## 2017-10-12 DIAGNOSIS — Z23 Encounter for immunization: Secondary | ICD-10-CM | POA: Diagnosis not present

## 2017-10-26 ENCOUNTER — Ambulatory Visit: Payer: Medicare Other | Admitting: Podiatry

## 2017-10-26 ENCOUNTER — Encounter: Payer: Self-pay | Admitting: Podiatry

## 2017-10-26 DIAGNOSIS — E119 Type 2 diabetes mellitus without complications: Secondary | ICD-10-CM | POA: Diagnosis not present

## 2017-10-26 DIAGNOSIS — Q828 Other specified congenital malformations of skin: Secondary | ICD-10-CM | POA: Diagnosis not present

## 2017-10-26 DIAGNOSIS — B351 Tinea unguium: Secondary | ICD-10-CM

## 2017-10-26 DIAGNOSIS — M79676 Pain in unspecified toe(s): Secondary | ICD-10-CM

## 2017-10-26 NOTE — Progress Notes (Signed)
Complaint:  Visit Type: Patient returns to my office for continued preventative foot care services. Complaint: Patient states" my nails have grown long and thick and become painful to walk and wear shoes" Patient has been diagnosed with DM with no foot complications. The patient presents for preventative foot care services. No changes to ROS.  Painful callus right foot.  Podiatric Exam: Vascular: dorsalis pedis and posterior tibial pulses are palpable bilateral. Capillary return is immediate. Temperature gradient is WNL. Skin turgor WNL  Sensorium: Normal Semmes Weinstein monofilament test. Normal tactile sensation bilaterally. Nail Exam: Pt has thick disfigured discolored nails with subungual debris noted bilateral entire nail hallux through fifth toenails Ulcer Exam: There is no evidence of ulcer or pre-ulcerative changes or infection. Orthopedic Exam: Muscle tone and strength are WNL. No limitations in general ROM. No crepitus or effusions noted. Foot type and digits show no abnormalities. Bony prominences are unremarkable. Skin: No Porokeratosis. No infection or ulcers.  Porokeratosis right foot  Diagnosis:  Onychomycosis, , Pain in right toe, pain in left toes  Porokeratosis  Treatment & Plan Procedures and Treatment: Consent by patient was obtained for treatment procedures. The patient understood the discussion of treatment and procedures well. All questions were answered thoroughly reviewed. Debridement of mycotic and hypertrophic toenails, 1 through 5 bilateral and clearing of subungual debris. No ulceration, no infection noted. Debride porokeratosis Return Visit-Office Procedure: Patient instructed to return to the office for a follow up visit 3 months for continued evaluation and treatment.    Gardiner Barefoot DPM

## 2017-11-18 ENCOUNTER — Ambulatory Visit
Admission: RE | Admit: 2017-11-18 | Discharge: 2017-11-18 | Disposition: A | Discharge status: Home / Self Care | Payer: Medicare Other | Source: Ambulatory Visit | Attending: Family Medicine | Admitting: Family Medicine

## 2017-11-18 ENCOUNTER — Other Ambulatory Visit: Payer: Self-pay | Admitting: Family Medicine

## 2017-11-18 DIAGNOSIS — M130 Polyarthritis, unspecified: Secondary | ICD-10-CM

## 2017-11-18 DIAGNOSIS — M47812 Spondylosis without myelopathy or radiculopathy, cervical region: Secondary | ICD-10-CM | POA: Diagnosis not present

## 2018-02-01 ENCOUNTER — Ambulatory Visit: Payer: Medicare Other | Admitting: Podiatry

## 2018-02-24 ENCOUNTER — Ambulatory Visit: Payer: Medicare Other | Admitting: Podiatry

## 2018-02-24 ENCOUNTER — Encounter: Payer: Self-pay | Admitting: Podiatry

## 2018-02-24 DIAGNOSIS — E119 Type 2 diabetes mellitus without complications: Secondary | ICD-10-CM

## 2018-02-24 DIAGNOSIS — Q828 Other specified congenital malformations of skin: Secondary | ICD-10-CM

## 2018-02-24 DIAGNOSIS — M79676 Pain in unspecified toe(s): Secondary | ICD-10-CM | POA: Diagnosis not present

## 2018-02-24 DIAGNOSIS — B351 Tinea unguium: Secondary | ICD-10-CM | POA: Diagnosis not present

## 2018-02-24 NOTE — Progress Notes (Signed)
Complaint:  Visit Type: Patient returns to my office for continued preventative foot care services. Complaint: Patient states" my nails have grown long and thick and become painful to walk and wear shoes" Patient has been diagnosed with DM with no foot complications. The patient presents for preventative foot care services. No changes to ROS.  Painful callus right foot.  Podiatric Exam: Vascular: dorsalis pedis and posterior tibial pulses are palpable bilateral. Capillary return is immediate. Temperature gradient is WNL. Skin turgor WNL  Sensorium: Normal Semmes Weinstein monofilament test. Normal tactile sensation bilaterally. Nail Exam: Pt has thick disfigured discolored nails with subungual debris noted bilateral entire nail hallux through fifth toenails Ulcer Exam: There is no evidence of ulcer or pre-ulcerative changes or infection. Orthopedic Exam: Muscle tone and strength are WNL. No limitations in general ROM. No crepitus or effusions noted. Foot type and digits show no abnormalities. Bony prominences are unremarkable. Skin: No Porokeratosis. No infection or ulcers.  Porokeratosis right foot  Diagnosis:  Onychomycosis, , Pain in right toe, pain in left toes  Porokeratosis  Treatment & Plan Procedures and Treatment: Consent by patient was obtained for treatment procedures. The patient understood the discussion of treatment and procedures well. All questions were answered thoroughly reviewed. Debridement of mycotic and hypertrophic toenails, 1 through 5 bilateral and clearing of subungual debris. No ulceration, no infection noted. Debride porokeratosis Return Visit-Office Procedure: Patient instructed to return to the office for a follow up visit 4 months for continued evaluation and treatment.    Gardiner Barefoot DPM

## 2018-02-28 ENCOUNTER — Ambulatory Visit: Payer: Medicare Other | Admitting: Podiatry

## 2018-04-05 DIAGNOSIS — H68101 Unspecified obstruction of Eustachian tube, right ear: Secondary | ICD-10-CM | POA: Diagnosis not present

## 2018-04-05 DIAGNOSIS — J3089 Other allergic rhinitis: Secondary | ICD-10-CM | POA: Diagnosis not present

## 2018-04-11 DIAGNOSIS — M25512 Pain in left shoulder: Secondary | ICD-10-CM | POA: Diagnosis not present

## 2018-04-11 DIAGNOSIS — R21 Rash and other nonspecific skin eruption: Secondary | ICD-10-CM | POA: Diagnosis not present

## 2018-05-02 DIAGNOSIS — I10 Essential (primary) hypertension: Secondary | ICD-10-CM | POA: Diagnosis not present

## 2018-05-02 DIAGNOSIS — E1165 Type 2 diabetes mellitus with hyperglycemia: Secondary | ICD-10-CM | POA: Diagnosis not present

## 2018-05-02 DIAGNOSIS — M130 Polyarthritis, unspecified: Secondary | ICD-10-CM | POA: Diagnosis not present

## 2018-05-03 DIAGNOSIS — Z0000 Encounter for general adult medical examination without abnormal findings: Secondary | ICD-10-CM | POA: Diagnosis not present

## 2018-05-03 DIAGNOSIS — E118 Type 2 diabetes mellitus with unspecified complications: Secondary | ICD-10-CM | POA: Diagnosis not present

## 2018-05-03 DIAGNOSIS — I10 Essential (primary) hypertension: Secondary | ICD-10-CM | POA: Diagnosis not present

## 2018-05-03 DIAGNOSIS — M130 Polyarthritis, unspecified: Secondary | ICD-10-CM | POA: Diagnosis not present

## 2018-05-25 DIAGNOSIS — L02221 Furuncle of abdominal wall: Secondary | ICD-10-CM | POA: Diagnosis not present

## 2018-05-25 DIAGNOSIS — B9689 Other specified bacterial agents as the cause of diseases classified elsewhere: Secondary | ICD-10-CM | POA: Diagnosis not present

## 2018-05-25 DIAGNOSIS — L308 Other specified dermatitis: Secondary | ICD-10-CM | POA: Diagnosis not present

## 2018-06-28 ENCOUNTER — Ambulatory Visit: Payer: Medicare Other | Admitting: Podiatry

## 2018-06-28 ENCOUNTER — Encounter: Payer: Self-pay | Admitting: Podiatry

## 2018-06-28 DIAGNOSIS — Q828 Other specified congenital malformations of skin: Secondary | ICD-10-CM | POA: Diagnosis not present

## 2018-06-28 DIAGNOSIS — M79676 Pain in unspecified toe(s): Secondary | ICD-10-CM | POA: Diagnosis not present

## 2018-06-28 DIAGNOSIS — E119 Type 2 diabetes mellitus without complications: Secondary | ICD-10-CM | POA: Diagnosis not present

## 2018-06-28 DIAGNOSIS — B351 Tinea unguium: Secondary | ICD-10-CM | POA: Diagnosis not present

## 2018-06-28 DIAGNOSIS — L608 Other nail disorders: Secondary | ICD-10-CM

## 2018-06-28 NOTE — Progress Notes (Signed)
Complaint:  Visit Type: Patient returns to my office for continued preventative foot care services. Complaint: Patient states" my nails have grown long and thick and become painful to walk and wear shoes" Patient has been diagnosed with DM with no foot complications. The patient presents for preventative foot care services. No changes to ROS.  Painful callus right foot.  Podiatric Exam: Vascular: dorsalis pedis and posterior tibial pulses are palpable bilateral. Capillary return is immediate. Temperature gradient is WNL. Skin turgor WNL  Sensorium: Normal Semmes Weinstein monofilament test. Normal tactile sensation bilaterally. Nail Exam: Pt has thick disfigured discolored nails with subungual debris noted bilateral entire nail hallux through fifth toenails Ulcer Exam: There is no evidence of ulcer or pre-ulcerative changes or infection. Orthopedic Exam: Muscle tone and strength are WNL. No limitations in general ROM. No crepitus or effusions noted. Foot type and digits show no abnormalities. Bony prominences are unremarkable. Skin: No Porokeratosis. No infection or ulcers.  Porokeratosis  Sub 1 right foot  Diagnosis:  Onychomycosis, , Pain in right toe, pain in left toes  Porokeratosis  Treatment & Plan Procedures and Treatment: Consent by patient was obtained for treatment procedures. The patient understood the discussion of treatment and procedures well. All questions were answered thoroughly reviewed. Debridement of mycotic and hypertrophic toenails, 1 through 5 bilateral and clearing of subungual debris. No ulceration, no infection noted. Debride porokeratosis Return Visit-Office Procedure: Patient instructed to return to the office for a follow up visit 3 months for continued evaluation and treatment.    Tarren Velardi DPM 

## 2018-07-11 DIAGNOSIS — K5730 Diverticulosis of large intestine without perforation or abscess without bleeding: Secondary | ICD-10-CM | POA: Diagnosis not present

## 2018-07-11 DIAGNOSIS — Z86010 Personal history of colonic polyps: Secondary | ICD-10-CM | POA: Diagnosis not present

## 2018-07-11 DIAGNOSIS — Z1211 Encounter for screening for malignant neoplasm of colon: Secondary | ICD-10-CM | POA: Diagnosis not present

## 2018-07-11 DIAGNOSIS — K219 Gastro-esophageal reflux disease without esophagitis: Secondary | ICD-10-CM | POA: Diagnosis not present

## 2018-07-11 DIAGNOSIS — K5904 Chronic idiopathic constipation: Secondary | ICD-10-CM | POA: Diagnosis not present

## 2018-07-14 DIAGNOSIS — N400 Enlarged prostate without lower urinary tract symptoms: Secondary | ICD-10-CM | POA: Diagnosis not present

## 2018-07-31 DIAGNOSIS — N400 Enlarged prostate without lower urinary tract symptoms: Secondary | ICD-10-CM | POA: Diagnosis not present

## 2018-08-16 DIAGNOSIS — D125 Benign neoplasm of sigmoid colon: Secondary | ICD-10-CM | POA: Diagnosis not present

## 2018-08-16 DIAGNOSIS — D123 Benign neoplasm of transverse colon: Secondary | ICD-10-CM | POA: Diagnosis not present

## 2018-08-16 DIAGNOSIS — D120 Benign neoplasm of cecum: Secondary | ICD-10-CM | POA: Diagnosis not present

## 2018-08-16 DIAGNOSIS — Z1211 Encounter for screening for malignant neoplasm of colon: Secondary | ICD-10-CM | POA: Diagnosis not present

## 2018-08-16 DIAGNOSIS — K635 Polyp of colon: Secondary | ICD-10-CM | POA: Diagnosis not present

## 2018-09-27 ENCOUNTER — Ambulatory Visit: Payer: Medicare Other | Admitting: Podiatry

## 2018-10-02 ENCOUNTER — Ambulatory Visit: Payer: Medicare Other | Admitting: Podiatry

## 2018-10-02 ENCOUNTER — Encounter: Payer: Self-pay | Admitting: Podiatry

## 2018-10-02 DIAGNOSIS — Q828 Other specified congenital malformations of skin: Secondary | ICD-10-CM

## 2018-10-02 DIAGNOSIS — B351 Tinea unguium: Secondary | ICD-10-CM | POA: Diagnosis not present

## 2018-10-02 DIAGNOSIS — M79676 Pain in unspecified toe(s): Secondary | ICD-10-CM | POA: Diagnosis not present

## 2018-10-04 NOTE — Progress Notes (Signed)
Subjective:   Patient ID: Geoffrey Castro, male   DOB: 67 y.o.   MRN: [id number]   HPI Patient presents with significant nail disease 1-5 both feet and lesion formation third digit right that is painful when pressed and make shoe gear difficult   ROS      Objective:  Physical Exam  Thick yellow brittle nailbeds 1-5 both feet that are thick incurvated and tender along with lesion third right that is painful when palpated     Assessment:  Mycotic nail infection 1-5 both feet with lesion formation right     Plan:  Debrided nailbeds 1-5 both feet with no iatrogenic bleeding and debrided lesions with no iatrogenic bleeding noted

## 2018-10-11 DIAGNOSIS — M130 Polyarthritis, unspecified: Secondary | ICD-10-CM | POA: Diagnosis not present

## 2018-10-11 DIAGNOSIS — R9431 Abnormal electrocardiogram [ECG] [EKG]: Secondary | ICD-10-CM | POA: Diagnosis not present

## 2018-10-11 DIAGNOSIS — E118 Type 2 diabetes mellitus with unspecified complications: Secondary | ICD-10-CM | POA: Diagnosis not present

## 2018-10-11 DIAGNOSIS — I10 Essential (primary) hypertension: Secondary | ICD-10-CM | POA: Diagnosis not present

## 2019-01-02 ENCOUNTER — Ambulatory Visit: Payer: Medicare Other | Admitting: Podiatry

## 2019-01-02 ENCOUNTER — Encounter: Payer: Self-pay | Admitting: Podiatry

## 2019-01-02 DIAGNOSIS — L608 Other nail disorders: Secondary | ICD-10-CM | POA: Diagnosis not present

## 2019-01-02 DIAGNOSIS — M79676 Pain in unspecified toe(s): Secondary | ICD-10-CM

## 2019-01-02 DIAGNOSIS — B351 Tinea unguium: Secondary | ICD-10-CM

## 2019-01-02 DIAGNOSIS — Q828 Other specified congenital malformations of skin: Secondary | ICD-10-CM

## 2019-01-02 DIAGNOSIS — E119 Type 2 diabetes mellitus without complications: Secondary | ICD-10-CM

## 2019-01-02 NOTE — Progress Notes (Signed)
Complaint:  Visit Type: Patient returns to my office for continued preventative foot care services. Complaint: Patient states" my nails have grown long and thick and become painful to walk and wear shoes" Patient has been diagnosed with DM with no foot complications. The patient presents for preventative foot care services. No changes to ROS.  Painful callus right foot.  Podiatric Exam: Vascular: dorsalis pedis and posterior tibial pulses are palpable bilateral. Capillary return is immediate. Temperature gradient is WNL. Skin turgor WNL  Sensorium: Normal Semmes Weinstein monofilament test. Normal tactile sensation bilaterally. Nail Exam: Pt has thick disfigured discolored nails with subungual debris noted bilateral entire nail hallux through fifth toenails Ulcer Exam: There is no evidence of ulcer or pre-ulcerative changes or infection. Orthopedic Exam: Muscle tone and strength are WNL. No limitations in general ROM. No crepitus or effusions noted. Foot type and digits show no abnormalities. Bony prominences are unremarkable. Skin: No Porokeratosis. No infection or ulcers.  Porokeratosis  Sub 1 right foot  Diagnosis:  Onychomycosis, , Pain in right toe, pain in left toes  Porokeratosis  Treatment & Plan Procedures and Treatment: Consent by patient was obtained for treatment procedures. The patient understood the discussion of treatment and procedures well. All questions were answered thoroughly reviewed. Debridement of mycotic and hypertrophic toenails, 1 through 5 bilateral and clearing of subungual debris. No ulceration, no infection noted. Debride porokeratosis Return Visit-Office Procedure: Patient instructed to return to the office for a follow up visit 3 months for continued evaluation and treatment.    Adisa Litt DPM 

## 2019-01-09 DIAGNOSIS — M130 Polyarthritis, unspecified: Secondary | ICD-10-CM | POA: Diagnosis not present

## 2019-01-09 DIAGNOSIS — E119 Type 2 diabetes mellitus without complications: Secondary | ICD-10-CM | POA: Diagnosis not present

## 2019-01-09 DIAGNOSIS — I10 Essential (primary) hypertension: Secondary | ICD-10-CM | POA: Diagnosis not present

## 2019-01-11 DIAGNOSIS — H60392 Other infective otitis externa, left ear: Secondary | ICD-10-CM | POA: Diagnosis not present

## 2019-01-11 DIAGNOSIS — I10 Essential (primary) hypertension: Secondary | ICD-10-CM | POA: Diagnosis not present

## 2019-01-11 DIAGNOSIS — Z0000 Encounter for general adult medical examination without abnormal findings: Secondary | ICD-10-CM | POA: Diagnosis not present

## 2019-01-11 DIAGNOSIS — L853 Xerosis cutis: Secondary | ICD-10-CM | POA: Diagnosis not present

## 2019-03-12 DIAGNOSIS — E1169 Type 2 diabetes mellitus with other specified complication: Secondary | ICD-10-CM | POA: Diagnosis not present

## 2019-03-12 DIAGNOSIS — R21 Rash and other nonspecific skin eruption: Secondary | ICD-10-CM | POA: Diagnosis not present

## 2019-03-12 DIAGNOSIS — I10 Essential (primary) hypertension: Secondary | ICD-10-CM | POA: Diagnosis not present

## 2019-04-03 ENCOUNTER — Ambulatory Visit: Payer: Medicare Other | Admitting: Podiatry

## 2019-05-22 ENCOUNTER — Other Ambulatory Visit: Payer: Self-pay

## 2019-05-22 ENCOUNTER — Ambulatory Visit: Payer: Medicare Other | Admitting: Podiatry

## 2019-05-22 ENCOUNTER — Encounter: Payer: Self-pay | Admitting: Podiatry

## 2019-05-22 VITALS — Temp 98.4°F

## 2019-05-22 DIAGNOSIS — L608 Other nail disorders: Secondary | ICD-10-CM

## 2019-05-22 DIAGNOSIS — E119 Type 2 diabetes mellitus without complications: Secondary | ICD-10-CM | POA: Diagnosis not present

## 2019-05-22 DIAGNOSIS — B351 Tinea unguium: Secondary | ICD-10-CM | POA: Diagnosis not present

## 2019-05-22 DIAGNOSIS — Q828 Other specified congenital malformations of skin: Secondary | ICD-10-CM

## 2019-05-22 DIAGNOSIS — M79676 Pain in unspecified toe(s): Secondary | ICD-10-CM | POA: Diagnosis not present

## 2019-05-22 NOTE — Progress Notes (Signed)
Complaint:  Visit Type: Patient returns to my office for continued preventative foot care services. Complaint: Patient states" my nails have grown long and thick and become painful to walk and wear shoes" Patient has been diagnosed with DM with no foot complications. The patient presents for preventative foot care services. No changes to ROS.  Painful callus right foot.  Podiatric Exam: Vascular: dorsalis pedis and posterior tibial pulses are palpable bilateral. Capillary return is immediate. Temperature gradient is WNL. Skin turgor WNL  Sensorium: Normal Semmes Weinstein monofilament test. Normal tactile sensation bilaterally. Nail Exam: Pt has thick disfigured discolored nails with subungual debris noted bilateral entire nail hallux through fifth toenails Ulcer Exam: There is no evidence of ulcer or pre-ulcerative changes or infection. Orthopedic Exam: Muscle tone and strength are WNL. No limitations in general ROM. No crepitus or effusions noted. Foot type and digits show no abnormalities. Bony prominences are unremarkable. Skin: No Porokeratosis. No infection or ulcers.  Porokeratosis  Sub 1 right foot  Diagnosis:  Onychomycosis, , Pain in right toe, pain in left toes  Porokeratosis  Treatment & Plan Procedures and Treatment: Consent by patient was obtained for treatment procedures. The patient understood the discussion of treatment and procedures well. All questions were answered thoroughly reviewed. Debridement of mycotic and hypertrophic toenails, 1 through 5 bilateral and clearing of subungual debris. No ulceration, no infection noted. Debride porokeratosis Return Visit-Office Procedure: Patient instructed to return to the office for a follow up visit 3 months for continued evaluation and treatment.    Gardiner Barefoot DPM

## 2019-07-11 DIAGNOSIS — I10 Essential (primary) hypertension: Secondary | ICD-10-CM | POA: Diagnosis not present

## 2019-07-11 DIAGNOSIS — E1169 Type 2 diabetes mellitus with other specified complication: Secondary | ICD-10-CM | POA: Diagnosis not present

## 2019-07-11 DIAGNOSIS — R799 Abnormal finding of blood chemistry, unspecified: Secondary | ICD-10-CM | POA: Diagnosis not present

## 2019-07-11 DIAGNOSIS — R7309 Other abnormal glucose: Secondary | ICD-10-CM | POA: Diagnosis not present

## 2019-07-12 DIAGNOSIS — E1169 Type 2 diabetes mellitus with other specified complication: Secondary | ICD-10-CM | POA: Diagnosis not present

## 2019-07-12 DIAGNOSIS — I10 Essential (primary) hypertension: Secondary | ICD-10-CM | POA: Diagnosis not present

## 2019-08-15 DIAGNOSIS — I10 Essential (primary) hypertension: Secondary | ICD-10-CM | POA: Diagnosis not present

## 2019-08-24 ENCOUNTER — Other Ambulatory Visit: Payer: Self-pay

## 2019-08-24 ENCOUNTER — Ambulatory Visit (INDEPENDENT_AMBULATORY_CARE_PROVIDER_SITE_OTHER): Payer: Medicare Other | Admitting: Podiatry

## 2019-08-24 ENCOUNTER — Encounter: Payer: Self-pay | Admitting: Podiatry

## 2019-08-24 DIAGNOSIS — E119 Type 2 diabetes mellitus without complications: Secondary | ICD-10-CM | POA: Diagnosis not present

## 2019-08-24 DIAGNOSIS — L608 Other nail disorders: Secondary | ICD-10-CM | POA: Diagnosis not present

## 2019-08-24 DIAGNOSIS — B351 Tinea unguium: Secondary | ICD-10-CM

## 2019-08-24 DIAGNOSIS — Q828 Other specified congenital malformations of skin: Secondary | ICD-10-CM

## 2019-08-24 DIAGNOSIS — M79676 Pain in unspecified toe(s): Secondary | ICD-10-CM | POA: Diagnosis not present

## 2019-08-24 NOTE — Progress Notes (Signed)
Complaint:  Visit Type: Patient returns to my office for continued preventative foot care services. Complaint: Patient states" my nails have grown long and thick and become painful to walk and wear shoes" Patient has been diagnosed with DM with no foot complications. The patient presents for preventative foot care services. No changes to ROS.  Painful callus right foot.  Podiatric Exam: Vascular: dorsalis pedis and posterior tibial pulses are palpable bilateral. Capillary return is immediate. Temperature gradient is WNL. Skin turgor WNL  Sensorium: Normal Semmes Weinstein monofilament test. Normal tactile sensation bilaterally. Nail Exam: Pt has thick disfigured discolored nails with subungual debris noted bilateral entire nail hallux through fifth toenails Ulcer Exam: There is no evidence of ulcer or pre-ulcerative changes or infection. Orthopedic Exam: Muscle tone and strength are WNL. No limitations in general ROM. No crepitus or effusions noted. Foot type and digits show no abnormalities. Bony prominences are unremarkable. Skin: No Porokeratosis. No infection or ulcers.  Porokeratosis  Sub 1 right foot  Diagnosis:  Onychomycosis, , Pain in right toe, pain in left toes  Porokeratosis  Treatment & Plan Procedures and Treatment: Consent by patient was obtained for treatment procedures. The patient understood the discussion of treatment and procedures well. All questions were answered thoroughly reviewed. Debridement of mycotic and hypertrophic toenails, 1 through 5 bilateral and clearing of subungual debris. No ulceration, no infection noted. Debride porokeratosis Return Visit-Office Procedure: Patient instructed to return to the office for a follow up visit 3 months for continued evaluation and treatment.    Odean Mcelwain DPM 

## 2019-09-13 DIAGNOSIS — Z7189 Other specified counseling: Secondary | ICD-10-CM | POA: Diagnosis not present

## 2019-10-16 ENCOUNTER — Other Ambulatory Visit: Payer: Self-pay | Admitting: Family Medicine

## 2019-10-16 ENCOUNTER — Ambulatory Visit
Admission: RE | Admit: 2019-10-16 | Discharge: 2019-10-16 | Disposition: A | Discharge status: Home / Self Care | Payer: Medicare Other | Source: Ambulatory Visit | Attending: Family Medicine | Admitting: Family Medicine

## 2019-10-16 DIAGNOSIS — I10 Essential (primary) hypertension: Secondary | ICD-10-CM | POA: Diagnosis not present

## 2019-10-16 DIAGNOSIS — R52 Pain, unspecified: Secondary | ICD-10-CM

## 2019-10-16 DIAGNOSIS — M130 Polyarthritis, unspecified: Secondary | ICD-10-CM | POA: Diagnosis not present

## 2019-10-16 DIAGNOSIS — E1169 Type 2 diabetes mellitus with other specified complication: Secondary | ICD-10-CM | POA: Diagnosis not present

## 2019-10-16 DIAGNOSIS — M25562 Pain in left knee: Secondary | ICD-10-CM | POA: Diagnosis not present

## 2019-11-08 ENCOUNTER — Other Ambulatory Visit: Payer: Self-pay

## 2019-11-08 DIAGNOSIS — Z20822 Contact with and (suspected) exposure to covid-19: Secondary | ICD-10-CM

## 2019-11-11 LAB — NOVEL CORONAVIRUS, NAA: SARS-CoV-2, NAA: NOT DETECTED

## 2019-11-20 DIAGNOSIS — I10 Essential (primary) hypertension: Secondary | ICD-10-CM | POA: Diagnosis not present

## 2019-11-20 DIAGNOSIS — M130 Polyarthritis, unspecified: Secondary | ICD-10-CM | POA: Diagnosis not present

## 2019-11-30 ENCOUNTER — Other Ambulatory Visit: Payer: Self-pay

## 2019-11-30 ENCOUNTER — Encounter: Payer: Self-pay | Admitting: Podiatry

## 2019-11-30 ENCOUNTER — Ambulatory Visit (INDEPENDENT_AMBULATORY_CARE_PROVIDER_SITE_OTHER): Payer: Medicare Other | Admitting: Podiatry

## 2019-11-30 DIAGNOSIS — B351 Tinea unguium: Secondary | ICD-10-CM

## 2019-11-30 DIAGNOSIS — E119 Type 2 diabetes mellitus without complications: Secondary | ICD-10-CM | POA: Diagnosis not present

## 2019-11-30 DIAGNOSIS — M79676 Pain in unspecified toe(s): Secondary | ICD-10-CM | POA: Diagnosis not present

## 2019-11-30 DIAGNOSIS — Q828 Other specified congenital malformations of skin: Secondary | ICD-10-CM | POA: Diagnosis not present

## 2019-11-30 DIAGNOSIS — L608 Other nail disorders: Secondary | ICD-10-CM

## 2019-11-30 NOTE — Progress Notes (Signed)
Complaint:  Visit Type: Patient returns to my office for continued preventative foot care services. Complaint: Patient states" my nails have grown long and thick and become painful to walk and wear shoes" Patient has been diagnosed with DM with no foot complications. The patient presents for preventative foot care services. No changes to ROS.  Painful callus right foot.  Podiatric Exam: Vascular: dorsalis pedis and posterior tibial pulses are palpable bilateral. Capillary return is immediate. Temperature gradient is WNL. Skin turgor WNL  Sensorium: Normal Semmes Weinstein monofilament test. Normal tactile sensation bilaterally. Nail Exam: Pt has thick disfigured discolored nails with subungual debris noted bilateral entire nail hallux through fifth toenails Ulcer Exam: There is no evidence of ulcer or pre-ulcerative changes or infection. Orthopedic Exam: Muscle tone and strength are WNL. No limitations in general ROM. No crepitus or effusions noted. Foot type and digits show no abnormalities. Bony prominences are unremarkable. Skin: No Porokeratosis. No infection or ulcers.  Porokeratosis  Sub 1 right foot  Diagnosis:  Onychomycosis, , Pain in right toe, pain in left toes  Porokeratosis  Treatment & Plan Procedures and Treatment: Consent by patient was obtained for treatment procedures. The patient understood the discussion of treatment and procedures well. All questions were answered thoroughly reviewed. Debridement of mycotic and hypertrophic toenails, 1 through 5 bilateral and clearing of subungual debris. No ulceration, no infection noted. Debride porokeratosis Return Visit-Office Procedure: Patient instructed to return to the office for a follow up visit 3 months for continued evaluation and treatment.    Gardiner Barefoot DPM

## 2020-02-27 ENCOUNTER — Other Ambulatory Visit: Payer: Self-pay

## 2020-02-27 ENCOUNTER — Ambulatory Visit: Payer: Medicare Other | Admitting: Podiatry

## 2020-02-27 ENCOUNTER — Encounter: Payer: Self-pay | Admitting: Podiatry

## 2020-02-27 DIAGNOSIS — M79676 Pain in unspecified toe(s): Secondary | ICD-10-CM | POA: Diagnosis not present

## 2020-02-27 DIAGNOSIS — L608 Other nail disorders: Secondary | ICD-10-CM

## 2020-02-27 DIAGNOSIS — B351 Tinea unguium: Secondary | ICD-10-CM | POA: Diagnosis not present

## 2020-02-27 DIAGNOSIS — E119 Type 2 diabetes mellitus without complications: Secondary | ICD-10-CM | POA: Diagnosis not present

## 2020-02-27 DIAGNOSIS — Q828 Other specified congenital malformations of skin: Secondary | ICD-10-CM | POA: Diagnosis not present

## 2020-02-27 NOTE — Progress Notes (Signed)
This patient returns to the office for evaluation and treatment of long thick painful nails .  This patient is unable to trim his own nails since the patient cannot reach the feet.  Patient says the nails are painful walking and wearing his shoes. He also says he has a painful callus under the big toe joint right foot.  This callus is painful walking and wearing her shoes.   He returns for preventive foot care services.  General Appearance  Alert, conversant and in no acute stress.  Vascular  Dorsalis pedis and posterior tibial  pulses are palpable  bilaterally.  Capillary return is within normal limits  bilaterally. Temperature is within normal limits  bilaterally.  Neurologic  Senn-Weinstein monofilament wire test within normal limits  bilaterally. Muscle power within normal limits bilaterally.  Nails Thick disfigured discolored nails with subungual debris  from hallux to fifth toes bilaterally. No evidence of bacterial infection or drainage bilaterally.  Orthopedic  No limitations of motion  feet .  No crepitus or effusions noted.  No bony pathology or digital deformities noted.  Skin  normotropic skin  noted bilaterally.  No signs of infections or ulcers noted.   Porokeratosis sub 1 right foot.  Onychomycosis  Pain in toes right foot  Pain in toes left foot.  Porokeratosis right foot.  Debridement  of nails  1-5  B/L with a nail nipper.  Nails were then filed using a dremel tool with no incidents. Debride callus with # 15 blade.   RTC  3 months.   Geoffrey Castro DPM  

## 2020-03-19 DIAGNOSIS — E1169 Type 2 diabetes mellitus with other specified complication: Secondary | ICD-10-CM | POA: Diagnosis not present

## 2020-03-19 DIAGNOSIS — M130 Polyarthritis, unspecified: Secondary | ICD-10-CM | POA: Diagnosis not present

## 2020-03-19 DIAGNOSIS — I10 Essential (primary) hypertension: Secondary | ICD-10-CM | POA: Diagnosis not present

## 2020-03-19 DIAGNOSIS — E786 Lipoprotein deficiency: Secondary | ICD-10-CM | POA: Diagnosis not present

## 2020-03-20 DIAGNOSIS — I10 Essential (primary) hypertension: Secondary | ICD-10-CM | POA: Diagnosis not present

## 2020-03-20 DIAGNOSIS — E1169 Type 2 diabetes mellitus with other specified complication: Secondary | ICD-10-CM | POA: Diagnosis not present

## 2020-05-30 ENCOUNTER — Ambulatory Visit: Payer: Medicare Other | Admitting: Podiatry

## 2020-05-30 ENCOUNTER — Other Ambulatory Visit: Payer: Self-pay

## 2020-05-30 ENCOUNTER — Encounter: Payer: Self-pay | Admitting: Podiatry

## 2020-05-30 DIAGNOSIS — M79676 Pain in unspecified toe(s): Secondary | ICD-10-CM | POA: Diagnosis not present

## 2020-05-30 DIAGNOSIS — E119 Type 2 diabetes mellitus without complications: Secondary | ICD-10-CM | POA: Diagnosis not present

## 2020-05-30 DIAGNOSIS — B351 Tinea unguium: Secondary | ICD-10-CM | POA: Diagnosis not present

## 2020-05-30 DIAGNOSIS — Q828 Other specified congenital malformations of skin: Secondary | ICD-10-CM

## 2020-05-30 NOTE — Progress Notes (Signed)
This patient returns to the office for evaluation and treatment of long thick painful nails .  This patient is unable to trim his own nails since the patient cannot reach the feet.  Patient says the nails are painful walking and wearing his shoes. He also says he has a painful callus under the big toe joint right foot.  This callus is painful walking and wearing her shoes.   He returns for preventive foot care services.  General Appearance  Alert, conversant and in no acute stress.  Vascular  Dorsalis pedis and posterior tibial  pulses are palpable  bilaterally.  Capillary return is within normal limits  bilaterally. Temperature is within normal limits  bilaterally.  Neurologic  Senn-Weinstein monofilament wire test within normal limits  bilaterally. Muscle power within normal limits bilaterally.  Nails Thick disfigured discolored nails with subungual debris  from hallux to fifth toes bilaterally. No evidence of bacterial infection or drainage bilaterally.  Orthopedic  No limitations of motion  feet .  No crepitus or effusions noted.  No bony pathology or digital deformities noted.  Skin  normotropic skin  noted bilaterally.  No signs of infections or ulcers noted.   Porokeratosis sub 1 right foot.  Onychomycosis  Pain in toes right foot  Pain in toes left foot.  Porokeratosis right foot.  Debridement  of nails  1-5  B/L with a nail nipper.  Nails were then filed using a dremel tool with no incidents. Debride callus with # 15 blade.   RTC  3 months.   Gardiner Barefoot DPM

## 2020-08-05 DIAGNOSIS — R350 Frequency of micturition: Secondary | ICD-10-CM | POA: Diagnosis not present

## 2020-09-09 ENCOUNTER — Ambulatory Visit: Payer: Medicare Other | Admitting: Podiatry

## 2020-09-09 ENCOUNTER — Encounter: Payer: Self-pay | Admitting: Podiatry

## 2020-09-09 ENCOUNTER — Other Ambulatory Visit: Payer: Self-pay

## 2020-09-09 DIAGNOSIS — L608 Other nail disorders: Secondary | ICD-10-CM | POA: Diagnosis not present

## 2020-09-09 DIAGNOSIS — E119 Type 2 diabetes mellitus without complications: Secondary | ICD-10-CM | POA: Diagnosis not present

## 2020-09-09 DIAGNOSIS — B351 Tinea unguium: Secondary | ICD-10-CM | POA: Diagnosis not present

## 2020-09-09 DIAGNOSIS — Q828 Other specified congenital malformations of skin: Secondary | ICD-10-CM | POA: Diagnosis not present

## 2020-09-09 DIAGNOSIS — M79676 Pain in unspecified toe(s): Secondary | ICD-10-CM | POA: Diagnosis not present

## 2020-09-09 NOTE — Progress Notes (Addendum)
This patient returns to the office for evaluation and treatment of long thick painful nails .  This patient is unable to trim his own nails since the patient cannot reach the feet.  Patient says the nails are painful walking and wearing his shoes. He also says he has a painful callus under the big toe joint right foot.  This callus is painful walking and wearing her shoes.   He returns for preventive foot care services.  Patient is diabetic on Januvia.  General Appearance  Alert, conversant and in no acute stress.  Vascular  Dorsalis pedis and posterior tibial  pulses are palpable  bilaterally.  Capillary return is within normal limits  bilaterally. Temperature is within normal limits  bilaterally.  Neurologic  Senn-Weinstein monofilament wire test within normal limits  bilaterally. Muscle power within normal limits bilaterally.  Nails Thick disfigured discolored nails with subungual debris  from hallux to fifth toes bilaterally. No evidence of bacterial infection or drainage bilaterally.  Orthopedic  No limitations of motion  feet .  No crepitus or effusions noted.  No bony pathology or digital deformities noted.  Skin  normotropic skin  noted bilaterally.  No signs of infections or ulcers noted.   Porokeratosis sub 1 right foot.  Onychomycosis  Pain in toes right foot  Pain in toes left foot.  Porokeratosis right foot.  Debridement  of nails  1-5  B/L with a nail nipper.  Nails were then filed using a dremel tool with no incidents. Debride callus with # 15 blade.   RTC  3 months.   Donicia Druck DPM  

## 2020-10-03 DIAGNOSIS — E1169 Type 2 diabetes mellitus with other specified complication: Secondary | ICD-10-CM | POA: Diagnosis not present

## 2020-10-03 DIAGNOSIS — I10 Essential (primary) hypertension: Secondary | ICD-10-CM | POA: Diagnosis not present

## 2020-10-04 DIAGNOSIS — I10 Essential (primary) hypertension: Secondary | ICD-10-CM | POA: Diagnosis not present

## 2020-10-04 DIAGNOSIS — M130 Polyarthritis, unspecified: Secondary | ICD-10-CM | POA: Diagnosis not present

## 2020-10-04 DIAGNOSIS — Z23 Encounter for immunization: Secondary | ICD-10-CM | POA: Diagnosis not present

## 2020-10-04 DIAGNOSIS — E1169 Type 2 diabetes mellitus with other specified complication: Secondary | ICD-10-CM | POA: Diagnosis not present

## 2020-10-04 DIAGNOSIS — N3281 Overactive bladder: Secondary | ICD-10-CM | POA: Diagnosis not present

## 2020-10-20 DIAGNOSIS — R350 Frequency of micturition: Secondary | ICD-10-CM | POA: Diagnosis not present

## 2020-11-03 DIAGNOSIS — Z0000 Encounter for general adult medical examination without abnormal findings: Secondary | ICD-10-CM | POA: Diagnosis not present

## 2020-11-03 DIAGNOSIS — I10 Essential (primary) hypertension: Secondary | ICD-10-CM | POA: Diagnosis not present

## 2020-11-03 DIAGNOSIS — N3281 Overactive bladder: Secondary | ICD-10-CM | POA: Diagnosis not present

## 2020-11-03 DIAGNOSIS — E1169 Type 2 diabetes mellitus with other specified complication: Secondary | ICD-10-CM | POA: Diagnosis not present

## 2020-11-27 DIAGNOSIS — S76919S Strain of unspecified muscles, fascia and tendons at thigh level, unspecified thigh, sequela: Secondary | ICD-10-CM | POA: Diagnosis not present

## 2020-11-27 DIAGNOSIS — E1169 Type 2 diabetes mellitus with other specified complication: Secondary | ICD-10-CM | POA: Diagnosis not present

## 2020-11-27 DIAGNOSIS — I10 Essential (primary) hypertension: Secondary | ICD-10-CM | POA: Diagnosis not present

## 2020-11-27 DIAGNOSIS — M130 Polyarthritis, unspecified: Secondary | ICD-10-CM | POA: Diagnosis not present

## 2020-12-02 DIAGNOSIS — E1169 Type 2 diabetes mellitus with other specified complication: Secondary | ICD-10-CM | POA: Diagnosis not present

## 2020-12-02 DIAGNOSIS — K644 Residual hemorrhoidal skin tags: Secondary | ICD-10-CM | POA: Diagnosis not present

## 2020-12-09 DIAGNOSIS — K649 Unspecified hemorrhoids: Secondary | ICD-10-CM | POA: Diagnosis not present

## 2020-12-09 DIAGNOSIS — M130 Polyarthritis, unspecified: Secondary | ICD-10-CM | POA: Diagnosis not present

## 2020-12-09 DIAGNOSIS — I10 Essential (primary) hypertension: Secondary | ICD-10-CM | POA: Diagnosis not present

## 2020-12-09 DIAGNOSIS — E1169 Type 2 diabetes mellitus with other specified complication: Secondary | ICD-10-CM | POA: Diagnosis not present

## 2020-12-10 ENCOUNTER — Other Ambulatory Visit: Payer: Self-pay

## 2020-12-10 ENCOUNTER — Encounter: Payer: Self-pay | Admitting: Podiatry

## 2020-12-10 ENCOUNTER — Ambulatory Visit: Payer: Medicare Other | Admitting: Podiatry

## 2020-12-10 DIAGNOSIS — Q828 Other specified congenital malformations of skin: Secondary | ICD-10-CM

## 2020-12-10 DIAGNOSIS — B351 Tinea unguium: Secondary | ICD-10-CM | POA: Diagnosis not present

## 2020-12-10 DIAGNOSIS — L608 Other nail disorders: Secondary | ICD-10-CM

## 2020-12-10 DIAGNOSIS — E119 Type 2 diabetes mellitus without complications: Secondary | ICD-10-CM

## 2020-12-10 DIAGNOSIS — M79676 Pain in unspecified toe(s): Secondary | ICD-10-CM

## 2020-12-10 NOTE — Progress Notes (Signed)
This patient returns to the office for evaluation and treatment of long thick painful nails .  This patient is unable to trim his own nails since the patient cannot reach the feet.  Patient says the nails are painful walking and wearing his shoes. He also says he has a painful callus under the big toe joint right foot.  This callus is painful walking and wearing her shoes.   He returns for preventive foot care services.  Patient is diabetic on Januvia.  General Appearance  Alert, conversant and in no acute stress.  Vascular  Dorsalis pedis and posterior tibial  pulses are palpable  bilaterally.  Capillary return is within normal limits  bilaterally. Temperature is within normal limits  bilaterally.  Neurologic  Senn-Weinstein monofilament wire test within normal limits  bilaterally. Muscle power within normal limits bilaterally.  Nails Thick disfigured discolored nails with subungual debris  from hallux to fifth toes bilaterally. No evidence of bacterial infection or drainage bilaterally.  Orthopedic  No limitations of motion  feet .  No crepitus or effusions noted.  No bony pathology or digital deformities noted.  Skin  normotropic skin  noted bilaterally.  No signs of infections or ulcers noted.   Porokeratosis sub 1 right foot.  Onychomycosis  Pain in toes right foot  Pain in toes left foot.  Porokeratosis right foot.  Debridement  of nails  1-5  B/L with a nail nipper.  Nails were then filed using a dremel tool with no incidents. Debride callus with # 15 blade.   RTC  3 months.   Jquan Egelston DPM  

## 2020-12-26 DIAGNOSIS — M5459 Other low back pain: Secondary | ICD-10-CM | POA: Diagnosis not present

## 2020-12-26 DIAGNOSIS — M5136 Other intervertebral disc degeneration, lumbar region: Secondary | ICD-10-CM | POA: Diagnosis not present

## 2020-12-26 DIAGNOSIS — R52 Pain, unspecified: Secondary | ICD-10-CM | POA: Diagnosis not present

## 2020-12-26 DIAGNOSIS — M810 Age-related osteoporosis without current pathological fracture: Secondary | ICD-10-CM | POA: Diagnosis not present

## 2021-01-21 DIAGNOSIS — M545 Low back pain, unspecified: Secondary | ICD-10-CM | POA: Diagnosis not present

## 2021-01-27 DIAGNOSIS — E1169 Type 2 diabetes mellitus with other specified complication: Secondary | ICD-10-CM | POA: Diagnosis not present

## 2021-01-27 DIAGNOSIS — M130 Polyarthritis, unspecified: Secondary | ICD-10-CM | POA: Diagnosis not present

## 2021-01-27 DIAGNOSIS — I10 Essential (primary) hypertension: Secondary | ICD-10-CM | POA: Diagnosis not present

## 2021-01-27 DIAGNOSIS — M545 Low back pain, unspecified: Secondary | ICD-10-CM | POA: Diagnosis not present

## 2021-02-02 DIAGNOSIS — M810 Age-related osteoporosis without current pathological fracture: Secondary | ICD-10-CM | POA: Diagnosis not present

## 2021-02-02 DIAGNOSIS — M545 Low back pain, unspecified: Secondary | ICD-10-CM | POA: Diagnosis not present

## 2021-02-06 DIAGNOSIS — M545 Low back pain, unspecified: Secondary | ICD-10-CM | POA: Diagnosis not present

## 2021-02-06 DIAGNOSIS — E1169 Type 2 diabetes mellitus with other specified complication: Secondary | ICD-10-CM | POA: Diagnosis not present

## 2021-02-06 DIAGNOSIS — I10 Essential (primary) hypertension: Secondary | ICD-10-CM | POA: Diagnosis not present

## 2021-02-06 DIAGNOSIS — M810 Age-related osteoporosis without current pathological fracture: Secondary | ICD-10-CM | POA: Diagnosis not present

## 2021-02-10 DIAGNOSIS — M545 Low back pain, unspecified: Secondary | ICD-10-CM | POA: Diagnosis not present

## 2021-02-18 DIAGNOSIS — M545 Low back pain, unspecified: Secondary | ICD-10-CM | POA: Diagnosis not present

## 2021-02-19 DIAGNOSIS — M5416 Radiculopathy, lumbar region: Secondary | ICD-10-CM | POA: Diagnosis not present

## 2021-02-24 DIAGNOSIS — M545 Low back pain, unspecified: Secondary | ICD-10-CM | POA: Diagnosis not present

## 2021-03-09 DIAGNOSIS — M810 Age-related osteoporosis without current pathological fracture: Secondary | ICD-10-CM | POA: Diagnosis not present

## 2021-03-09 DIAGNOSIS — I10 Essential (primary) hypertension: Secondary | ICD-10-CM | POA: Diagnosis not present

## 2021-03-09 DIAGNOSIS — E1169 Type 2 diabetes mellitus with other specified complication: Secondary | ICD-10-CM | POA: Diagnosis not present

## 2021-03-10 DIAGNOSIS — D45 Polycythemia vera: Secondary | ICD-10-CM | POA: Diagnosis not present

## 2021-03-10 DIAGNOSIS — N3281 Overactive bladder: Secondary | ICD-10-CM | POA: Diagnosis not present

## 2021-03-10 DIAGNOSIS — I10 Essential (primary) hypertension: Secondary | ICD-10-CM | POA: Diagnosis not present

## 2021-03-10 DIAGNOSIS — E1169 Type 2 diabetes mellitus with other specified complication: Secondary | ICD-10-CM | POA: Diagnosis not present

## 2021-03-11 ENCOUNTER — Ambulatory Visit: Payer: Medicare Other | Admitting: Podiatry

## 2021-03-11 ENCOUNTER — Other Ambulatory Visit: Payer: Self-pay

## 2021-03-11 ENCOUNTER — Encounter: Payer: Self-pay | Admitting: Podiatry

## 2021-03-11 DIAGNOSIS — B351 Tinea unguium: Secondary | ICD-10-CM | POA: Diagnosis not present

## 2021-03-11 DIAGNOSIS — E119 Type 2 diabetes mellitus without complications: Secondary | ICD-10-CM

## 2021-03-11 DIAGNOSIS — M79676 Pain in unspecified toe(s): Secondary | ICD-10-CM | POA: Diagnosis not present

## 2021-03-11 DIAGNOSIS — M545 Low back pain, unspecified: Secondary | ICD-10-CM | POA: Diagnosis not present

## 2021-03-11 DIAGNOSIS — Q828 Other specified congenital malformations of skin: Secondary | ICD-10-CM

## 2021-03-11 DIAGNOSIS — L608 Other nail disorders: Secondary | ICD-10-CM

## 2021-03-11 NOTE — Progress Notes (Signed)
This patient returns to the office for evaluation and treatment of long thick painful nails .  This patient is unable to trim his own nails since the patient cannot reach the feet.  Patient says the nails are painful walking and wearing his shoes. He also says he has a painful callus under the big toe joint right foot.  This callus is painful walking and wearing her shoes.   He returns for preventive foot care services.  Patient is diabetic on Januvia.  General Appearance  Alert, conversant and in no acute stress.  Vascular  Dorsalis pedis and posterior tibial  pulses are palpable  bilaterally.  Capillary return is within normal limits  bilaterally. Temperature is within normal limits  bilaterally.  Neurologic  Senn-Weinstein monofilament wire test within normal limits  bilaterally. Muscle power within normal limits bilaterally.  Nails Thick disfigured discolored nails with subungual debris  from hallux to fifth toes bilaterally. No evidence of bacterial infection or drainage bilaterally.  Orthopedic  No limitations of motion  feet .  No crepitus or effusions noted.  No bony pathology or digital deformities noted.  Skin  normotropic skin  noted bilaterally.  No signs of infections or ulcers noted.   Porokeratosis sub 1 right foot.  Onychomycosis  Pain in toes right foot  Pain in toes left foot.  Porokeratosis right foot.  Debridement  of nails  1-5  B/L with a nail nipper.  Nails were then filed using a dremel tool with no incidents. Debride callus with # 15 blade.   RTC  3 months.   Gardiner Barefoot DPM

## 2021-03-12 DIAGNOSIS — M5459 Other low back pain: Secondary | ICD-10-CM | POA: Diagnosis not present

## 2021-03-12 DIAGNOSIS — M1712 Unilateral primary osteoarthritis, left knee: Secondary | ICD-10-CM | POA: Diagnosis not present

## 2021-04-06 DIAGNOSIS — Z0000 Encounter for general adult medical examination without abnormal findings: Secondary | ICD-10-CM | POA: Diagnosis not present

## 2021-04-11 DIAGNOSIS — I10 Essential (primary) hypertension: Secondary | ICD-10-CM | POA: Diagnosis not present

## 2021-04-11 DIAGNOSIS — M130 Polyarthritis, unspecified: Secondary | ICD-10-CM | POA: Diagnosis not present

## 2021-04-13 ENCOUNTER — Other Ambulatory Visit: Payer: Self-pay | Admitting: Family Medicine

## 2021-04-13 ENCOUNTER — Ambulatory Visit
Admission: RE | Admit: 2021-04-13 | Discharge: 2021-04-13 | Disposition: A | Discharge status: Home / Self Care | Payer: Medicare Other | Source: Ambulatory Visit | Attending: Family Medicine | Admitting: Family Medicine

## 2021-04-13 DIAGNOSIS — R52 Pain, unspecified: Secondary | ICD-10-CM

## 2021-04-13 DIAGNOSIS — M7989 Other specified soft tissue disorders: Secondary | ICD-10-CM | POA: Diagnosis not present

## 2021-04-13 DIAGNOSIS — M25571 Pain in right ankle and joints of right foot: Secondary | ICD-10-CM | POA: Diagnosis not present

## 2021-04-13 DIAGNOSIS — M19071 Primary osteoarthritis, right ankle and foot: Secondary | ICD-10-CM | POA: Diagnosis not present

## 2021-04-13 DIAGNOSIS — A084 Viral intestinal infection, unspecified: Secondary | ICD-10-CM | POA: Diagnosis not present

## 2021-04-13 DIAGNOSIS — J301 Allergic rhinitis due to pollen: Secondary | ICD-10-CM | POA: Diagnosis not present

## 2021-04-16 ENCOUNTER — Ambulatory Visit: Payer: Medicare Other | Admitting: Podiatry

## 2021-04-16 ENCOUNTER — Other Ambulatory Visit: Payer: Self-pay

## 2021-04-16 ENCOUNTER — Encounter: Payer: Self-pay | Admitting: Podiatry

## 2021-04-16 ENCOUNTER — Ambulatory Visit: Payer: Medicare Other

## 2021-04-16 DIAGNOSIS — M19271 Secondary osteoarthritis, right ankle and foot: Secondary | ICD-10-CM

## 2021-04-16 NOTE — Progress Notes (Signed)
  Subjective:  Patient ID: Geoffrey Castro, male    DOB: [birth year],  MRN: [id number]  Chief Complaint  Patient presents with  . Foot Pain    Right foot pain. PT stated that the pain started las Thursday he stated that is tender and pain with pressure    70 y.o. male presents with the above complaint. History confirmed with patient.  Known to Dr. Prudence Davidson for his regular care.  He has had anterior right ankle pain that is getting worse his primary care doctor already took x-rays, this is a new issue for him  Objective:  Physical Exam: warm, good capillary refill, no trophic changes or ulcerative lesions and normal DP and PT pu Lses.  Right ankle painful along the anterior joint line and medial lateral gutters, worse with dorsiflexion Radiographs: X-ray of the right foot and ankle reviewed: Joint space narrowing of the ankle Assessment:   1. Other secondary osteoarthritis of right ankle      Plan:  Patient was evaluated and treated and all questions answered.  Discussed treatment options and etiology of ankle arthritis for him in detail including eventual arthrodesis or replacement.  Only intermittently bothersome.  Recommended injection therapy with corticosteroid, would prefer to avoid putting him on a standing dose of NSAIDs due to his age and diabetes.  Support with a Tri-Lock ankle brace.  This was dispensed today.  Following sterile prep with Betadine, 5 mg of Marcaine, 5 mg of Kenalog and 4 mg of dexamethasone was injected into the right ankle.  Return periodically if he needs further injections  Return if symptoms worsen or fail to improve.

## 2021-04-23 ENCOUNTER — Ambulatory Visit: Payer: Medicare Other | Admitting: Podiatry

## 2021-05-05 ENCOUNTER — Other Ambulatory Visit: Payer: Self-pay

## 2021-05-05 ENCOUNTER — Encounter: Payer: Self-pay | Admitting: Podiatry

## 2021-05-05 ENCOUNTER — Ambulatory Visit: Payer: Medicare Other | Admitting: Podiatry

## 2021-05-05 DIAGNOSIS — M76821 Posterior tibial tendinitis, right leg: Secondary | ICD-10-CM

## 2021-05-05 DIAGNOSIS — M19271 Secondary osteoarthritis, right ankle and foot: Secondary | ICD-10-CM

## 2021-05-05 NOTE — Progress Notes (Signed)
  Subjective:  Patient ID: Geoffrey Castro, male    DOB: [birth year],  MRN: [id number]  Chief Complaint  Patient presents with  . Arthritis    Right ankle  surgical consult-foot/ankle, right foot    70 y.o. male returns for follow-up with the above complaint. History confirmed with patient.  The injection did not help, he is interested to see if there is any surgical intervention that would help with this  Objective:  Physical Exam: warm, good capillary refill, no trophic changes or ulcerative lesions and normal DP and PT pu Lses.  Today more difficult to reproduce his pain, he complains of pain mostly in the PT tendon   radiographs: X-ray of the right foot and ankle reviewed: Joint space narrowing of the ankle Assessment:   1. Other secondary osteoarthritis of right ankle   2. Posterior tibial tendinitis of right lower extremity   3. Posterior tibial tendon dysfunction (PTTD) of right lower extremity      Plan:  Patient was evaluated and treated and all questions answered.  Discussed further treatment for him.  The ankle brace was not helpful.  The most the pain he is having along his PT tendon.  Could be some ankle arthritis as well.  I recommended a: MRI to evaluate the joint as well as the tendons to guide possible surgical treatment.  He will follow-up after the MRI  Return in about 4 weeks (around 06/02/2021) for after MRI to review.

## 2021-05-20 ENCOUNTER — Ambulatory Visit
Admission: RE | Admit: 2021-05-20 | Discharge: 2021-05-20 | Disposition: A | Discharge status: Home / Self Care | Payer: Medicare Other | Source: Ambulatory Visit | Attending: Podiatry | Admitting: Podiatry

## 2021-05-20 DIAGNOSIS — M76821 Posterior tibial tendinitis, right leg: Secondary | ICD-10-CM

## 2021-05-20 DIAGNOSIS — M19271 Secondary osteoarthritis, right ankle and foot: Secondary | ICD-10-CM

## 2021-05-20 DIAGNOSIS — M898X7 Other specified disorders of bone, ankle and foot: Secondary | ICD-10-CM | POA: Diagnosis not present

## 2021-05-20 DIAGNOSIS — M19071 Primary osteoarthritis, right ankle and foot: Secondary | ICD-10-CM | POA: Diagnosis not present

## 2021-05-20 DIAGNOSIS — M7989 Other specified soft tissue disorders: Secondary | ICD-10-CM | POA: Diagnosis not present

## 2021-06-02 ENCOUNTER — Encounter: Payer: Self-pay | Admitting: Podiatry

## 2021-06-02 ENCOUNTER — Ambulatory Visit: Payer: Medicare Other | Admitting: Podiatry

## 2021-06-02 ENCOUNTER — Other Ambulatory Visit: Payer: Self-pay

## 2021-06-02 DIAGNOSIS — M958 Other specified acquired deformities of musculoskeletal system: Secondary | ICD-10-CM

## 2021-06-02 DIAGNOSIS — M76821 Posterior tibial tendinitis, right leg: Secondary | ICD-10-CM | POA: Diagnosis not present

## 2021-06-02 NOTE — Progress Notes (Signed)
  Subjective:  Patient ID: Geoffrey Castro, male    DOB: 1951/02/18,  MRN: [id number]  Chief Complaint  Patient presents with   Arthritis    Right ankle, MRI results    70 y.o. male returns for follow-up with the above complaint. History confirmed with patient.  He completed the MRI.  Objective:  Physical Exam: warm, good capillary refill, no trophic changes or ulcerative lesions and normal DP and PT pu Lses.  Today more difficult to reproduce his pain, he complains of pain mostly in the PT tendon   radiographs: X-ray of the right foot and ankle reviewed: Joint space narrowing of the ankle  Study Result  Narrative & Impression  CLINICAL DATA:  Medial ankle pain for the past month. No injury or prior surgery.   EXAM: MRI OF THE RIGHT ANKLE WITHOUT CONTRAST   TECHNIQUE: Multiplanar, multisequence MR imaging of the ankle was performed. No intravenous contrast was administered.   COMPARISON:  Right foot and ankle x-rays dated April 13, 2021.   FINDINGS: TENDONS   Peroneal: Peroneal longus tendon intact. Peroneal brevis intact.   Posteromedial: Posterior tibial tendon intact with mild tendinosis distally. Flexor digitorum longus tendon intact. Flexor hallucis longus tendon intact.   Anterior: Tibialis anterior tendon intact. Extensor hallucis longus tendon intact Extensor digitorum longus tendon intact.   Achilles:  Intact.   Plantar Fascia: Intact.   LIGAMENTS   Lateral: Anterior talofibular ligament intact. Calcaneofibular ligament intact. Posterior talofibular ligament intact. Anterior and posterior tibiofibular ligaments intact.   Medial: Deltoid ligament intact. Spring ligament intact.   CARTILAGE   Ankle Joint: Mild degenerative change with small marginal osteophytes. No joint effusion. Small focus of marrow edema in the lateral talar dome with overlying cortical and chondral irregularity (series 8, image 19; series 6, image 8).   Subtalar  Joints/Sinus Tarsi: Normal subtalar joints. No subtalar joint effusion. Normal sinus tarsi.   Bones: Small focus of marrow edema in the peripheral lateral malleolus. No fracture or dislocation.   Soft Tissue: Mild bimalleolar soft tissue swelling. No soft tissue mass or fluid collection.   IMPRESSION: 1. Mild posterior tibial tendinosis.  No tear. 2. Small osteochondral lesion of the lateral talar dome. Mild tibiotalar osteoarthritis.     Electronically Signed   By: Titus Dubin M.D.   On: 05/21/2021 10:11   Assessment:   1. Posterior tibial tendinitis of right leg   2. Osteochondral defect of talus      Plan:  Patient was evaluated and treated and all questions answered.  Reviewed MRI findings.  Discussed with him that he would do best with physical therapy.  His pain is improved since last time.  Exercises given.  Consider physical therapy and brace or boot if not improving.  Return in about 6 weeks (around 07/14/2021) for re-check R posterior tibial tendinitis.

## 2021-06-02 NOTE — Patient Instructions (Signed)
Posterior Tibial Tendinitis  Posterior tibial tendinitis is irritation of a tendon called the posterior tibial tendon. Your posterior tibial tendon is a cord-like tissue that connects bones of your lower leg and foot to a muscle that: Supports your arch. Helps you raise up on your toes. Helps you turn your foot down and in. This condition causes foot and ankle pain. It can also lead to a flat foot. What are the causes? This condition is most often caused by repeated stress to the tendon (overuse injury). It can also be caused by a sudden injury that stresses the tendon, such as landing on your foot after jumping or falling. What increases the risk? This condition is more likely to develop in: People who play a sport that involves putting a lot of pressure on the feet, such as: Basketball. Tennis. Soccer. Hockey. Runners. Females who are older than 71 years of age and are overweight. People with diabetes. People with decreased foot stability. People with flat feet. What are the signs or symptoms? Symptoms include: Pain in the inner ankle. Pain at the arch of your foot. Pain that gets worse with running, walking, or standing. Swelling on the inside of your ankle and foot. Weakness in your ankle or foot. Inability to stand up on tiptoe. Flattening of the arch of your foot. How is this diagnosed? This condition may be diagnosed based on: Your symptoms. Your medical history. A physical exam. Tests, such as: X-ray. MRI. Ultrasound. How is this treated? This condition may be treated by: Putting ice to the injured area. Taking NSAIDs, such as ibuprofen, to reduce pain and swelling. Wearing a special shoe or shoe insert to support your arch (orthotic). Having physical therapy. Replacing high-impact exercise with low-impact exercise, such as swimming or cycling. If your symptoms do not improve with these treatments, you may need to wear a splint, removable walking boot, or short  leg cast for 6-8 weeks to keep your foot and ankle still (immobilized). Follow these instructions at home: If you have a cast, splint, or boot: Keep it clean and dry. Check the skin around it every day. Tell your health care provider about any concerns. If you have a cast: Do not stick anything inside it to scratch your skin. Doing that increases your risk of infection. You may put lotion on dry skin around the edges of the cast. Do not put lotion on the skin underneath the cast. If you have a splint or boot: Wear it as told by your health care provider. Remove it only as told by your health care provider. Loosen it if your toes tingle, become numb, or turn cold and blue. Bathing Do not take baths, swim, or use a hot tub until your health care provider approves. Ask your health care provider if you may take showers. If your cast, splint, or boot is not waterproof: Do not let it get wet. Cover it with a waterproof covering while you take a bath or a shower. Managing pain and swelling   If directed, put ice on the injured area. If you have a removable splint or boot, remove it as told by your health care provider. Put ice in a plastic bag. Place a towel between your skin and the bag or between your cast and the bag. Leave the ice on for 20 minutes, 2-3 times a day. Move your toes often to reduce stiffness and swelling. Raise (elevate) the injured area above the level of your heart while you are sitting  or lying down. Activity Do not use the injured foot to support your body weight until your health care provider says that you can. Use crutches as told by your health care provider. Do not do activities that make pain or swelling worse. Ask your health care provider when it is safe to drive if you have a cast, splint, or boot on your foot. Return to your normal activities as told by your health care provider. Ask your health care provider what activities are safe for you. Do exercises as  told by your health care provider. General instructions Take over-the-counter and prescription medicines only as told by your health care provider. If you have an orthotic, use it as told by your health care provider. Keep all follow-up visits as told by your health care provider. This is important. How is this prevented? Wear footwear that is appropriate to your athletic activity. Avoid athletic activities that cause pain or swelling in your ankle or foot. Before being active, do range-of-motion and stretching exercises. If you develop pain or swelling while training, stop training. If you have pain or swelling that does not improve after a few days of rest, see your health care provider. If you start a new athletic activity, start gradually so you can build up your strength and flexibility. Contact a health care provider if: Your symptoms get worse. Your symptoms do not improve in 6-8 weeks. You develop new, unexplained symptoms. Your splint, boot, or cast gets damaged. Summary Posterior tibial tendinitis is irritation of a tendon called the posterior tibial tendon. This condition is most often caused by repeated stress to the tendon (overuse injury). This condition causes foot pain and ankle pain. It can also lead to a flat foot. This condition may be treated by not doing high-impact activities, applying ice, having physical therapy, wearing orthotics, and wearing a cast, splint, or boot if needed. This information is not intended to replace advice given to you by your health care provider. Make sure you discuss any questions you have with your health care provider. Document Revised: 04/03/2019 Document Reviewed: 02/08/2019 Elsevier Patient Education  [employer].  Posterior Tibial Tendinitis Rehab Ask your health care provider which exercises are safe for you. Do exercises exactly as told by your health care provider and adjust them as directed. It is normal to feel mild  stretching, pulling, tightness, or discomfort as you do these exercises. Stop right away if you feel sudden pain or your pain gets worse. Do not begin these exercises until told by your health care provider. Stretching and range-of-motion exercises These exercises warm up your muscles and joints and improve the movement and flexibility in your ankle and foot. These exercises may also help to relieve pain. Standing wall calf stretch, knee straight   Stand with your hands against a wall. Extend your left / right leg behind you, and bend your front knee slightly. If directed, place a folded washcloth under the arch of your foot for support. Point the toes of your back foot slightly inward. Keeping your heels on the floor and your back knee straight, shift your weight toward the wall. Do not allow your back to arch. You should feel a gentle stretch in your upper left / right calf. Hold this position for 10 seconds. Repeat 10 times. Complete this exercise 2 times a day. Standing wall calf stretch, knee bent Stand with your hands against a wall. Extend your left / right leg behind you, and bend your front  knee slightly. If directed, place a folded washcloth under the arch of your foot for support. Point the toes of your back foot slightly inward. Unlock your back knee so it is bent. Keep your heels on the floor. You should feel a gentle stretch deep in your lower left / right calf. Hold this position for 10 seconds. Repeat 10 times. Complete this exercise 2 times a day. Strengthening exercises These exercises build strength and endurance in your ankle and foot. Endurance is the ability to use your muscles for a long time, even after they get tired. Ankle inversion with band Secure one end of a rubber exercise band or tubing to a fixed object, such as a table leg or a pole, that will stay still when the band is pulled. Loop the other end of the band around the middle of your left / right foot. Sit  on the floor facing the object with your left / right leg extended. The band or tube should be slightly tense when your foot is relaxed. Leading with your big toe, slowly bring your left / right foot and ankle inward, toward your other foot (inversion). Hold this position for 10 seconds. Slowly return your foot to the starting position. Repeat 10 times. Complete this exercise 2 times a day. Towel curls   Sit in a chair on a non-carpeted surface, and put your feet on the floor. Place a towel in front of your feet. Keeping your heel on the floor, put your left / right foot on the towel. Pull the towel toward you by grabbing the towel with your toes and curling them under. Keep your heel on the floor while you do this. Let your toes relax. Grab the towel with your toes again. Keep going until the towel is completely underneath your foot. Repeat 10 times. Complete this exercise 2 times a day. Balance exercise This exercise improves or maintains your balance. Balance is important in preventing falls. Single leg stand Without wearing shoes, stand near a railing or in a doorway. You may hold on to the railing or door frame as needed for balance. Stand on your left / right foot. Keep your big toe down on the floor and try to keep your arch lifted. If balancing in this position is too easy, try the exercise with your eyes closed or while standing on a pillow. Hold this position for 10 seconds. Repeat 10 times. Complete this exercise 2 times a day. This information is not intended to replace advice given to you by your health care provider. Make sure you discuss any questions you have with your health care provider.

## 2021-06-10 ENCOUNTER — Ambulatory Visit (INDEPENDENT_AMBULATORY_CARE_PROVIDER_SITE_OTHER): Payer: Medicare Other | Admitting: Podiatry

## 2021-06-10 ENCOUNTER — Other Ambulatory Visit: Payer: Self-pay

## 2021-06-10 ENCOUNTER — Encounter: Payer: Self-pay | Admitting: Podiatry

## 2021-06-10 DIAGNOSIS — E119 Type 2 diabetes mellitus without complications: Secondary | ICD-10-CM | POA: Diagnosis not present

## 2021-06-10 DIAGNOSIS — M79676 Pain in unspecified toe(s): Secondary | ICD-10-CM

## 2021-06-10 DIAGNOSIS — B351 Tinea unguium: Secondary | ICD-10-CM | POA: Diagnosis not present

## 2021-06-10 DIAGNOSIS — L608 Other nail disorders: Secondary | ICD-10-CM

## 2021-06-10 NOTE — Progress Notes (Signed)
This patient returns to the office for evaluation and treatment of long thick painful nails .  This patient is unable to trim his own nails since the patient cannot reach the feet.  Patient says the nails are painful walking and wearing his shoes. He also says he has a painful callus under the big toe joint right foot.  This callus is painful walking and wearing her shoes.   He returns for preventive foot care services.  Patient is diabetic on Januvia.  General Appearance  Alert, conversant and in no acute stress.  Vascular  Dorsalis pedis and posterior tibial  pulses are palpable  bilaterally.  Capillary return is within normal limits  bilaterally. Temperature is within normal limits  bilaterally.  Neurologic  Senn-Weinstein monofilament wire test within normal limits  bilaterally. Muscle power within normal limits bilaterally.  Nails Thick disfigured discolored nails with subungual debris  from hallux to fifth toes bilaterally. No evidence of bacterial infection or drainage bilaterally.  Orthopedic  No limitations of motion  feet .  No crepitus or effusions noted.  No bony pathology or digital deformities noted.  Skin  normotropic skin  noted bilaterally.  No signs of infections or ulcers noted.   Porokeratosis sub 1 right foot.  Onychomycosis  Pain in toes right foot  Pain in toes left foot.    Debridement  of nails  1-5  B/L with a nail nipper.  Nails were then filed using a dremel tool with no incidents. Marland Kitchen   RTC  10 weeks    Gardiner Barefoot DPM

## 2021-06-17 ENCOUNTER — Ambulatory Visit: Payer: Medicare Other | Admitting: Podiatry

## 2021-06-30 DIAGNOSIS — M5416 Radiculopathy, lumbar region: Secondary | ICD-10-CM | POA: Diagnosis not present

## 2021-07-09 DIAGNOSIS — R7309 Other abnormal glucose: Secondary | ICD-10-CM | POA: Diagnosis not present

## 2021-07-09 DIAGNOSIS — E785 Hyperlipidemia, unspecified: Secondary | ICD-10-CM | POA: Diagnosis not present

## 2021-07-09 DIAGNOSIS — I10 Essential (primary) hypertension: Secondary | ICD-10-CM | POA: Diagnosis not present

## 2021-07-10 DIAGNOSIS — E781 Pure hyperglyceridemia: Secondary | ICD-10-CM | POA: Diagnosis not present

## 2021-07-10 DIAGNOSIS — E1169 Type 2 diabetes mellitus with other specified complication: Secondary | ICD-10-CM | POA: Diagnosis not present

## 2021-07-10 DIAGNOSIS — I10 Essential (primary) hypertension: Secondary | ICD-10-CM | POA: Diagnosis not present

## 2021-07-14 ENCOUNTER — Ambulatory Visit: Payer: Medicare Other | Admitting: Podiatry

## 2021-07-21 DIAGNOSIS — M545 Low back pain, unspecified: Secondary | ICD-10-CM | POA: Diagnosis not present

## 2021-08-10 DIAGNOSIS — R350 Frequency of micturition: Secondary | ICD-10-CM | POA: Diagnosis not present

## 2021-08-18 DIAGNOSIS — M5416 Radiculopathy, lumbar region: Secondary | ICD-10-CM | POA: Diagnosis not present

## 2021-08-19 ENCOUNTER — Encounter: Payer: Self-pay | Admitting: Podiatry

## 2021-08-19 ENCOUNTER — Ambulatory Visit: Payer: Medicare Other | Admitting: Podiatry

## 2021-08-19 ENCOUNTER — Other Ambulatory Visit: Payer: Self-pay

## 2021-08-19 DIAGNOSIS — Q828 Other specified congenital malformations of skin: Secondary | ICD-10-CM

## 2021-08-19 DIAGNOSIS — B351 Tinea unguium: Secondary | ICD-10-CM

## 2021-08-19 DIAGNOSIS — L608 Other nail disorders: Secondary | ICD-10-CM

## 2021-08-19 DIAGNOSIS — E119 Type 2 diabetes mellitus without complications: Secondary | ICD-10-CM

## 2021-08-19 DIAGNOSIS — M79676 Pain in unspecified toe(s): Secondary | ICD-10-CM

## 2021-08-19 NOTE — Progress Notes (Signed)
This patient returns to the office for evaluation and treatment of long thick painful nails .  This patient is unable to trim his own nails since the patient cannot reach the feet.  Patient says the nails are painful walking and wearing his shoes. He also says he has a painful callus under the big toe joint right foot.  This callus is painful walking and wearing her shoes.   He returns for preventive foot care services.  Patient is diabetic on Januvia.  General Appearance  Alert, conversant and in no acute stress.  Vascular  Dorsalis pedis and posterior tibial  pulses are palpable  bilaterally.  Capillary return is within normal limits  bilaterally. Temperature is within normal limits  bilaterally.  Neurologic  Senn-Weinstein monofilament wire test within normal limits  bilaterally. Muscle power within normal limits bilaterally.  Nails Thick disfigured discolored nails with subungual debris  from hallux to fifth toes bilaterally. No evidence of bacterial infection or drainage bilaterally.  Orthopedic  No limitations of motion  feet .  No crepitus or effusions noted.  No bony pathology or digital deformities noted.  Skin  normotropic skin  noted bilaterally.  No signs of infections or ulcers noted.   Porokeratosis sub 1 right foot.  Onychomycosis  Pain in toes right foot  Pain in toes left foot.  Porokeratosis right foot  Debridement  of nails  1-5  B/L with a nail nipper.  Nails were then filed using a dremel tool with no incidents. . Debride callus right forefoot.    RTC  10 weeks    Gardiner Barefoot DPM

## 2021-09-01 DIAGNOSIS — M5136 Other intervertebral disc degeneration, lumbar region: Secondary | ICD-10-CM | POA: Diagnosis not present

## 2021-10-05 DIAGNOSIS — I10 Essential (primary) hypertension: Secondary | ICD-10-CM | POA: Diagnosis not present

## 2021-10-05 DIAGNOSIS — M545 Low back pain, unspecified: Secondary | ICD-10-CM | POA: Diagnosis not present

## 2021-10-05 DIAGNOSIS — E1169 Type 2 diabetes mellitus with other specified complication: Secondary | ICD-10-CM | POA: Diagnosis not present

## 2021-10-05 DIAGNOSIS — M1009 Idiopathic gout, multiple sites: Secondary | ICD-10-CM | POA: Diagnosis not present

## 2021-10-12 DIAGNOSIS — M10032 Idiopathic gout, left wrist: Secondary | ICD-10-CM | POA: Diagnosis not present

## 2021-10-20 ENCOUNTER — Ambulatory Visit: Payer: Medicare Other | Admitting: Podiatry

## 2021-10-30 ENCOUNTER — Encounter: Payer: Self-pay | Admitting: Podiatry

## 2021-10-30 ENCOUNTER — Ambulatory Visit: Payer: Medicare Other | Admitting: Podiatry

## 2021-10-30 ENCOUNTER — Other Ambulatory Visit: Payer: Self-pay

## 2021-10-30 DIAGNOSIS — M79676 Pain in unspecified toe(s): Secondary | ICD-10-CM

## 2021-10-30 DIAGNOSIS — Q828 Other specified congenital malformations of skin: Secondary | ICD-10-CM | POA: Diagnosis not present

## 2021-10-30 DIAGNOSIS — L608 Other nail disorders: Secondary | ICD-10-CM

## 2021-10-30 DIAGNOSIS — B351 Tinea unguium: Secondary | ICD-10-CM

## 2021-10-30 DIAGNOSIS — E119 Type 2 diabetes mellitus without complications: Secondary | ICD-10-CM | POA: Diagnosis not present

## 2021-10-30 NOTE — Progress Notes (Signed)
This patient returns to the office for evaluation and treatment of long thick painful nails .  This patient is unable to trim his own nails since the patient cannot reach the feet.  Patient says the nails are painful walking and wearing his shoes. He also says he has a painful callus under the big toe joint right foot.  This callus is painful walking and wearing her shoes.   He returns for preventive foot care services.  Patient is diabetic on Januvia.  General Appearance  Alert, conversant and in no acute stress.  Vascular  Dorsalis pedis and posterior tibial  pulses are palpable  bilaterally.  Capillary return is within normal limits  bilaterally. Temperature is within normal limits  bilaterally.  Neurologic  Senn-Weinstein monofilament wire test within normal limits  bilaterally. Muscle power within normal limits bilaterally.  Nails Thick disfigured discolored nails with subungual debris  from hallux to fifth toes bilaterally. No evidence of bacterial infection or drainage bilaterally.  Orthopedic  No limitations of motion  feet .  No crepitus or effusions noted.  No bony pathology or digital deformities noted.  Skin  normotropic skin  noted bilaterally.  No signs of infections or ulcers noted.   Porokeratosis sub 1 right foot.  Onychomycosis  Pain in toes right foot  Pain in toes left foot.  Porokeratosis right foot  Debridement  of nails  1-5  B/L with a nail nipper.  Nails were then filed using a dremel tool with no incidents. . Debride callus right forefoot.    RTC  10 weeks    Gardiner Barefoot DPM

## 2021-11-02 DIAGNOSIS — M545 Low back pain, unspecified: Secondary | ICD-10-CM | POA: Diagnosis not present

## 2022-01-11 ENCOUNTER — Ambulatory Visit: Payer: Medicare Other | Admitting: Podiatry

## 2022-01-19 DIAGNOSIS — F4381 Prolonged grief disorder: Secondary | ICD-10-CM | POA: Diagnosis not present

## 2022-01-19 DIAGNOSIS — Q782 Osteopetrosis: Secondary | ICD-10-CM | POA: Diagnosis not present

## 2022-01-19 DIAGNOSIS — I10 Essential (primary) hypertension: Secondary | ICD-10-CM | POA: Diagnosis not present

## 2022-01-19 DIAGNOSIS — F32A Depression, unspecified: Secondary | ICD-10-CM | POA: Diagnosis not present

## 2022-01-20 ENCOUNTER — Other Ambulatory Visit: Payer: Self-pay | Admitting: Family Medicine

## 2022-01-20 ENCOUNTER — Ambulatory Visit
Admission: RE | Admit: 2022-01-20 | Discharge: 2022-01-20 | Disposition: A | Discharge status: Home / Self Care | Payer: Medicare Other | Source: Ambulatory Visit | Attending: Family Medicine | Admitting: Family Medicine

## 2022-01-20 ENCOUNTER — Other Ambulatory Visit: Payer: Self-pay

## 2022-01-20 DIAGNOSIS — J9811 Atelectasis: Secondary | ICD-10-CM | POA: Diagnosis not present

## 2022-01-20 DIAGNOSIS — T1490XA Injury, unspecified, initial encounter: Secondary | ICD-10-CM

## 2022-01-20 DIAGNOSIS — S2242XA Multiple fractures of ribs, left side, initial encounter for closed fracture: Secondary | ICD-10-CM | POA: Diagnosis not present

## 2022-02-01 DIAGNOSIS — S2242XA Multiple fractures of ribs, left side, initial encounter for closed fracture: Secondary | ICD-10-CM | POA: Diagnosis not present

## 2022-02-01 DIAGNOSIS — F4381 Prolonged grief disorder: Secondary | ICD-10-CM | POA: Diagnosis not present

## 2022-02-01 DIAGNOSIS — E1169 Type 2 diabetes mellitus with other specified complication: Secondary | ICD-10-CM | POA: Diagnosis not present

## 2022-02-01 DIAGNOSIS — I10 Essential (primary) hypertension: Secondary | ICD-10-CM | POA: Diagnosis not present

## 2022-02-01 DIAGNOSIS — E7800 Pure hypercholesterolemia, unspecified: Secondary | ICD-10-CM | POA: Diagnosis not present

## 2022-02-08 ENCOUNTER — Other Ambulatory Visit: Payer: Self-pay

## 2022-02-08 ENCOUNTER — Ambulatory Visit: Payer: Medicare Other | Admitting: Podiatry

## 2022-02-08 ENCOUNTER — Encounter: Payer: Self-pay | Admitting: Podiatry

## 2022-02-08 DIAGNOSIS — B351 Tinea unguium: Secondary | ICD-10-CM

## 2022-02-08 DIAGNOSIS — L608 Other nail disorders: Secondary | ICD-10-CM

## 2022-02-08 DIAGNOSIS — Q828 Other specified congenital malformations of skin: Secondary | ICD-10-CM | POA: Diagnosis not present

## 2022-02-08 DIAGNOSIS — M79676 Pain in unspecified toe(s): Secondary | ICD-10-CM

## 2022-02-08 DIAGNOSIS — E119 Type 2 diabetes mellitus without complications: Secondary | ICD-10-CM

## 2022-02-08 NOTE — Progress Notes (Signed)
This patient returns to the office for evaluation and treatment of long thick painful nails .  This patient is unable to trim his own nails since the patient cannot reach the feet.  Patient says the nails are painful walking and wearing his shoes. He also says he has a painful callus under the big toe joint right foot.  This callus is painful walking and wearing her shoes.   He returns for preventive foot care services.  Patient is diabetic on Januvia.  General Appearance  Alert, conversant and in no acute stress.  Vascular  Dorsalis pedis and posterior tibial  pulses are palpable  bilaterally.  Capillary return is within normal limits  bilaterally. Temperature is within normal limits  bilaterally.  Neurologic  Senn-Weinstein monofilament wire test within normal limits  bilaterally. Muscle power within normal limits bilaterally.  Nails Thick disfigured discolored nails with subungual debris  from hallux to fifth toes bilaterally. No evidence of bacterial infection or drainage bilaterally.  Orthopedic  No limitations of motion  feet .  No crepitus or effusions noted.  No bony pathology or digital deformities noted.  Skin  normotropic skin  noted bilaterally.  No signs of infections or ulcers noted.   Porokeratosis sub 1 right foot.  Onychomycosis  Pain in toes right foot  Pain in toes left foot.  Porokeratosis right foot  Debridement  of nails  1-5  B/L with a nail nipper.  Nails were then filed using a dremel tool with no incidents. . Debride callus right forefoot with # 15 blade.   RTC  10 weeks    Tabrina Esty DPM  

## 2022-03-30 DIAGNOSIS — R0781 Pleurodynia: Secondary | ICD-10-CM | POA: Diagnosis not present

## 2022-04-14 ENCOUNTER — Ambulatory Visit: Payer: Medicare Other | Admitting: Podiatry

## 2022-04-14 ENCOUNTER — Encounter: Payer: Self-pay | Admitting: Podiatry

## 2022-04-14 DIAGNOSIS — B351 Tinea unguium: Secondary | ICD-10-CM

## 2022-04-14 DIAGNOSIS — M79676 Pain in unspecified toe(s): Secondary | ICD-10-CM

## 2022-04-14 DIAGNOSIS — L608 Other nail disorders: Secondary | ICD-10-CM | POA: Diagnosis not present

## 2022-04-14 DIAGNOSIS — E119 Type 2 diabetes mellitus without complications: Secondary | ICD-10-CM | POA: Diagnosis not present

## 2022-04-14 DIAGNOSIS — Q828 Other specified congenital malformations of skin: Secondary | ICD-10-CM | POA: Diagnosis not present

## 2022-04-14 NOTE — Progress Notes (Signed)
This patient returns to the office for evaluation and treatment of long thick painful nails .  This patient is unable to trim his own nails since the patient cannot reach the feet.  Patient says the nails are painful walking and wearing his shoes. He also says he has a painful callus under the big toe joint right foot.  This callus is painful walking and wearing her shoes.   He returns for preventive foot care services.  Patient is diabetic on Januvia.  General Appearance  Alert, conversant and in no acute stress.  Vascular  Dorsalis pedis and posterior tibial  pulses are palpable  bilaterally.  Capillary return is within normal limits  bilaterally. Temperature is within normal limits  bilaterally.  Neurologic  Senn-Weinstein monofilament wire test within normal limits  bilaterally. Muscle power within normal limits bilaterally.  Nails Thick disfigured discolored nails with subungual debris  from hallux to fifth toes bilaterally. No evidence of bacterial infection or drainage bilaterally.  Orthopedic  No limitations of motion  feet .  No crepitus or effusions noted.  No bony pathology or digital deformities noted.  Skin  normotropic skin  noted bilaterally.  No signs of infections or ulcers noted.   Porokeratosis sub 1 right foot.  Onychomycosis  Pain in toes right foot  Pain in toes left foot.  Porokeratosis right foot  Debridement  of nails  1-5  B/L with a nail nipper.  Nails were then filed using a dremel tool with no incidents. . Debride callus right forefoot with # 15 blade.   RTC  9 weeks    Karrington Mccravy DPM  

## 2022-04-23 DIAGNOSIS — R0781 Pleurodynia: Secondary | ICD-10-CM | POA: Diagnosis not present

## 2022-05-10 ENCOUNTER — Ambulatory Visit: Payer: Medicare Other | Admitting: Podiatry

## 2022-05-21 DIAGNOSIS — U071 COVID-19: Secondary | ICD-10-CM | POA: Diagnosis not present

## 2022-06-01 DIAGNOSIS — Z0000 Encounter for general adult medical examination without abnormal findings: Secondary | ICD-10-CM | POA: Diagnosis not present

## 2022-06-01 DIAGNOSIS — E1169 Type 2 diabetes mellitus with other specified complication: Secondary | ICD-10-CM | POA: Diagnosis not present

## 2022-06-01 DIAGNOSIS — Z87310 Personal history of (healed) osteoporosis fracture: Secondary | ICD-10-CM | POA: Diagnosis not present

## 2022-06-01 DIAGNOSIS — M130 Polyarthritis, unspecified: Secondary | ICD-10-CM | POA: Diagnosis not present

## 2022-06-01 DIAGNOSIS — F32A Depression, unspecified: Secondary | ICD-10-CM | POA: Diagnosis not present

## 2022-06-01 DIAGNOSIS — I10 Essential (primary) hypertension: Secondary | ICD-10-CM | POA: Diagnosis not present

## 2022-06-01 DIAGNOSIS — M10032 Idiopathic gout, left wrist: Secondary | ICD-10-CM | POA: Diagnosis not present

## 2022-07-07 ENCOUNTER — Encounter: Payer: Self-pay | Admitting: Podiatry

## 2022-07-07 ENCOUNTER — Ambulatory Visit: Payer: Medicare Other | Admitting: Podiatry

## 2022-07-07 DIAGNOSIS — B351 Tinea unguium: Secondary | ICD-10-CM

## 2022-07-07 DIAGNOSIS — L608 Other nail disorders: Secondary | ICD-10-CM

## 2022-07-07 DIAGNOSIS — E119 Type 2 diabetes mellitus without complications: Secondary | ICD-10-CM | POA: Diagnosis not present

## 2022-07-07 DIAGNOSIS — Q828 Other specified congenital malformations of skin: Secondary | ICD-10-CM

## 2022-07-07 DIAGNOSIS — M79676 Pain in unspecified toe(s): Secondary | ICD-10-CM

## 2022-07-07 DIAGNOSIS — M79674 Pain in right toe(s): Secondary | ICD-10-CM

## 2022-07-07 DIAGNOSIS — M79675 Pain in left toe(s): Secondary | ICD-10-CM | POA: Diagnosis not present

## 2022-07-07 NOTE — Progress Notes (Signed)
This patient returns to the office for evaluation and treatment of long thick painful nails .  This patient is unable to trim his own nails since the patient cannot reach the feet.  Patient says the nails are painful walking and wearing his shoes. He also says he has a painful callus under the big toe joint right foot.  This callus is painful walking and wearing her shoes.   He returns for preventive foot care services.  Patient is diabetic on Januvia.  General Appearance  Alert, conversant and in no acute stress.  Vascular  Dorsalis pedis and posterior tibial  pulses are palpable  bilaterally.  Capillary return is within normal limits  bilaterally. Temperature is within normal limits  bilaterally.  Neurologic  Senn-Weinstein monofilament wire test within normal limits  bilaterally. Muscle power within normal limits bilaterally.  Nails Thick disfigured discolored nails with subungual debris  from hallux to fifth toes bilaterally. No evidence of bacterial infection or drainage bilaterally.  Orthopedic  No limitations of motion  feet .  No crepitus or effusions noted.  No bony pathology or digital deformities noted.  Skin  normotropic skin  noted bilaterally.  No signs of infections or ulcers noted.   Porokeratosis sub 1 right foot.  Onychomycosis  Pain in toes right foot  Pain in toes left foot.  Porokeratosis right foot  Debridement  of nails  1-5  B/L with a nail nipper.  Nails were then filed using a dremel tool with no incidents. . Debride callus right forefoot with # 15 blade.   RTC  9 weeks    Gardiner Barefoot DPM

## 2022-07-20 DIAGNOSIS — E1169 Type 2 diabetes mellitus with other specified complication: Secondary | ICD-10-CM | POA: Diagnosis not present

## 2022-07-20 DIAGNOSIS — M1000 Idiopathic gout, unspecified site: Secondary | ICD-10-CM | POA: Diagnosis not present

## 2022-07-20 DIAGNOSIS — E782 Mixed hyperlipidemia: Secondary | ICD-10-CM | POA: Diagnosis not present

## 2022-07-20 DIAGNOSIS — I10 Essential (primary) hypertension: Secondary | ICD-10-CM | POA: Diagnosis not present

## 2022-07-21 DIAGNOSIS — I10 Essential (primary) hypertension: Secondary | ICD-10-CM | POA: Diagnosis not present

## 2022-07-21 DIAGNOSIS — E1169 Type 2 diabetes mellitus with other specified complication: Secondary | ICD-10-CM | POA: Diagnosis not present

## 2022-07-21 DIAGNOSIS — M109 Gout, unspecified: Secondary | ICD-10-CM | POA: Diagnosis not present

## 2022-08-14 IMAGING — CR DG RIBS W/ CHEST 3+V*L*
4 series · 4 of 4 positions shown · non-contrast
Comparison: 08/21/2008

CLINICAL DATA: Left-sided rib pain following fall, initial
encounter

EXAM:
LEFT RIBS AND CHEST - 3+ VIEW

[w chest pa (1 of 2)]
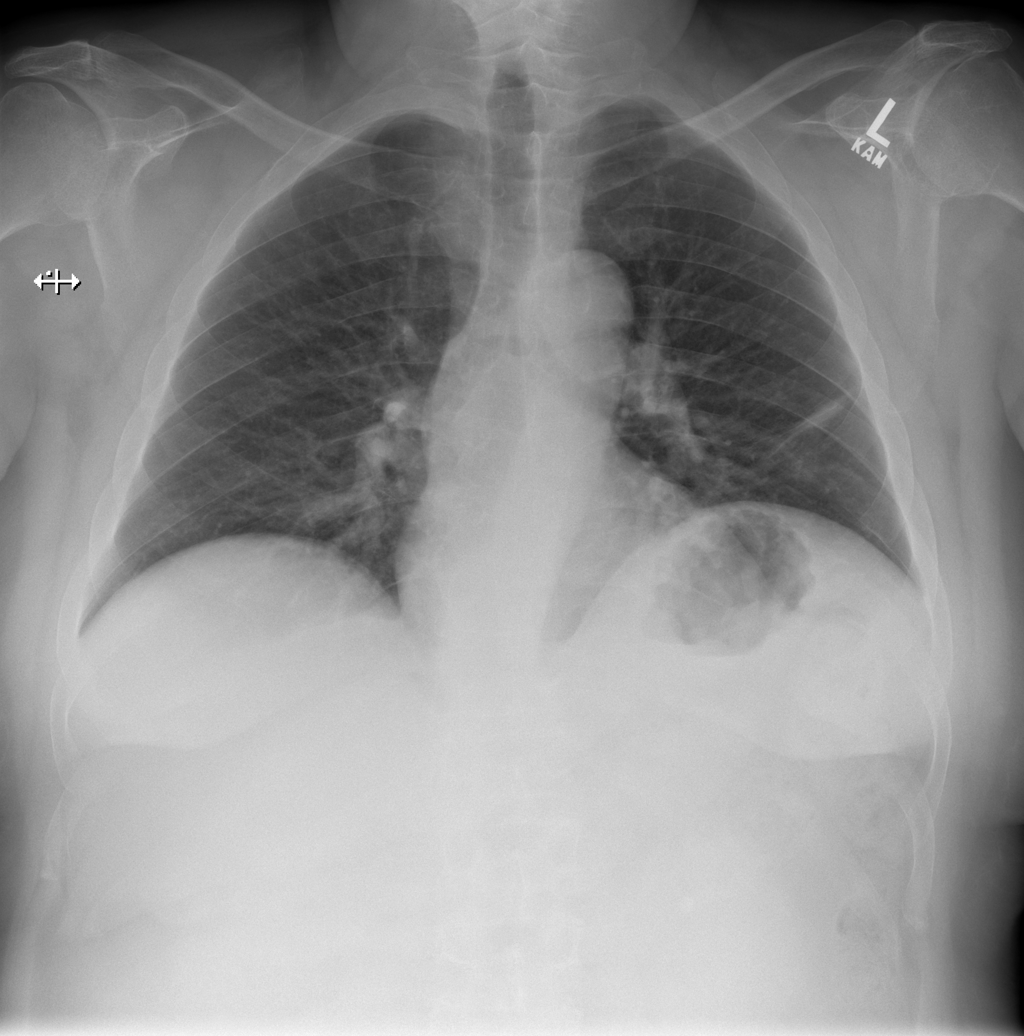

[w chest pa (2 of 2)]
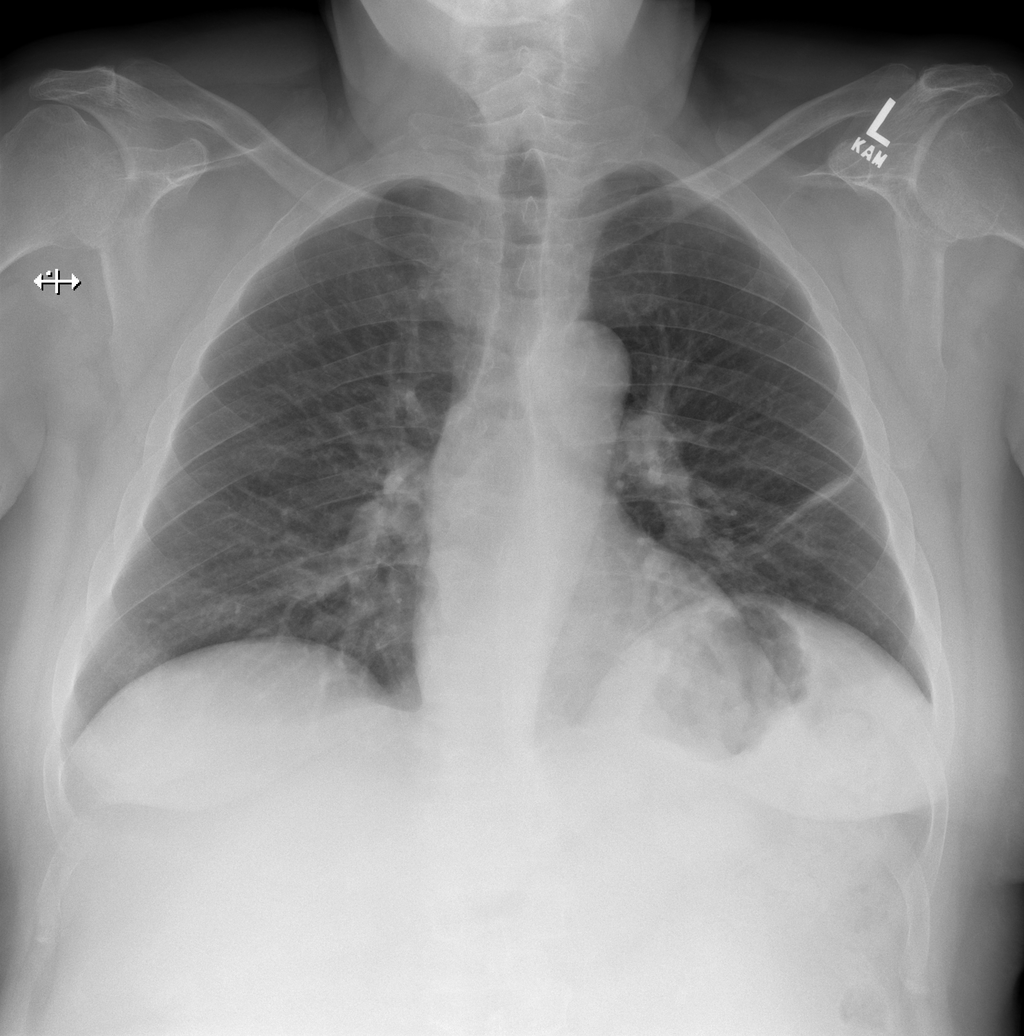

[w ribs ap lower left]
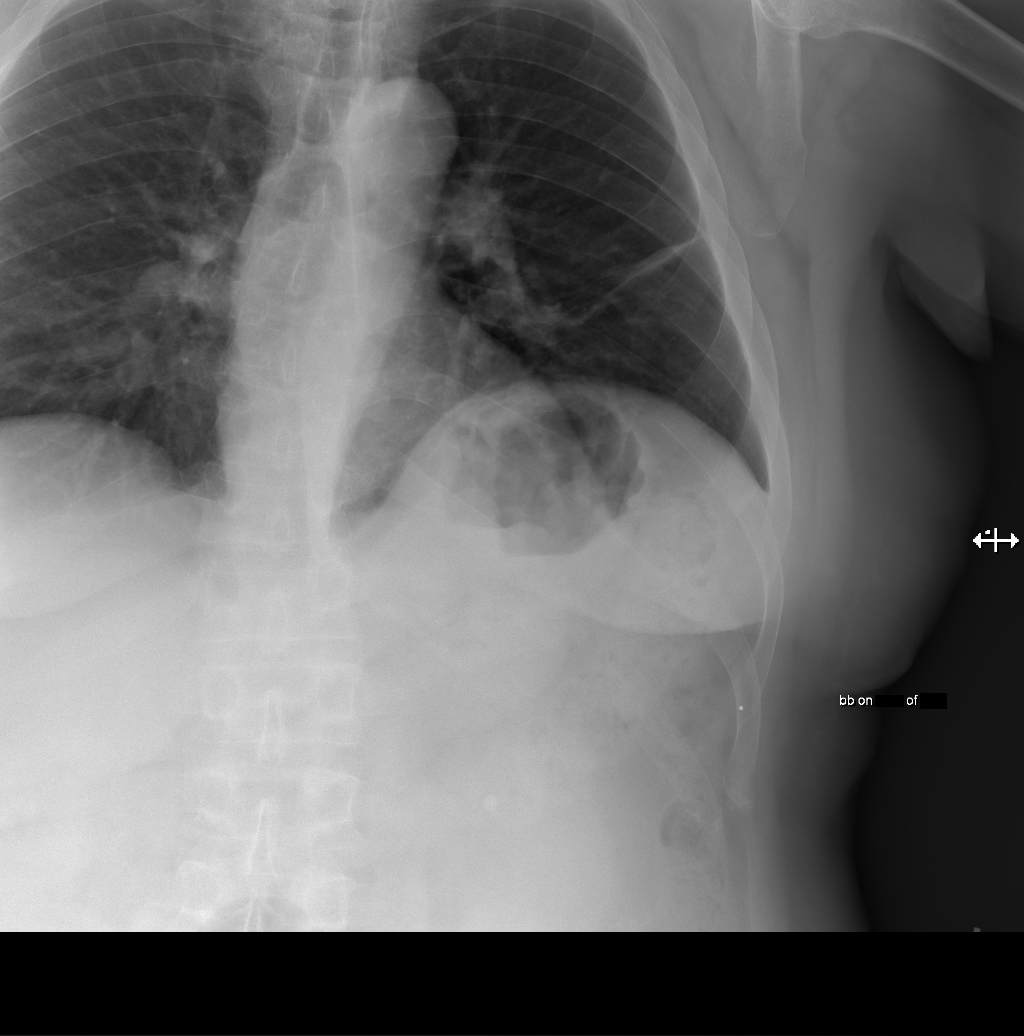

[w ribs obl left]
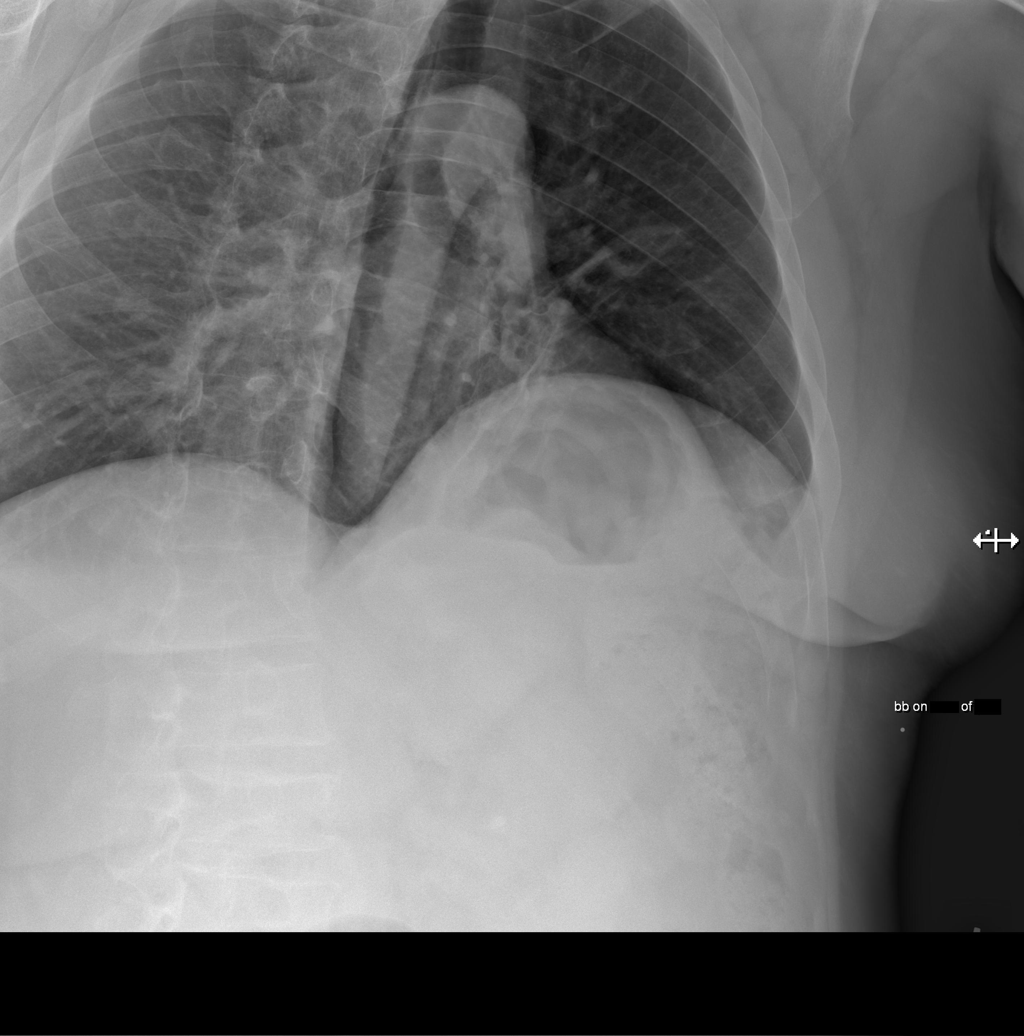

[4 of 4 positions shown; findings below may reference images not displayed]

FINDINGS: Cardiac shadows within normal limits. Mild platelike atelectasis is
noted in the left base. No pneumothorax or sizable effusion is seen.
Mildly displaced left eighth rib fracture is noted laterally.
IMPRESSION: Eighth rib fracture on the left laterally without complicating
factors.

## 2022-09-03 DIAGNOSIS — R350 Frequency of micturition: Secondary | ICD-10-CM | POA: Diagnosis not present

## 2022-09-09 DIAGNOSIS — E790 Hyperuricemia without signs of inflammatory arthritis and tophaceous disease: Secondary | ICD-10-CM | POA: Diagnosis not present

## 2022-09-10 DIAGNOSIS — E7800 Pure hypercholesterolemia, unspecified: Secondary | ICD-10-CM | POA: Diagnosis not present

## 2022-09-10 DIAGNOSIS — I10 Essential (primary) hypertension: Secondary | ICD-10-CM | POA: Diagnosis not present

## 2022-09-10 DIAGNOSIS — E1169 Type 2 diabetes mellitus with other specified complication: Secondary | ICD-10-CM | POA: Diagnosis not present

## 2022-10-13 ENCOUNTER — Encounter: Payer: Self-pay | Admitting: Podiatry

## 2022-10-13 ENCOUNTER — Ambulatory Visit: Payer: Medicare Other | Admitting: Podiatry

## 2022-10-13 DIAGNOSIS — M79676 Pain in unspecified toe(s): Secondary | ICD-10-CM | POA: Diagnosis not present

## 2022-10-13 DIAGNOSIS — E119 Type 2 diabetes mellitus without complications: Secondary | ICD-10-CM

## 2022-10-13 DIAGNOSIS — B351 Tinea unguium: Secondary | ICD-10-CM

## 2022-10-13 DIAGNOSIS — L608 Other nail disorders: Secondary | ICD-10-CM

## 2022-10-13 DIAGNOSIS — Q828 Other specified congenital malformations of skin: Secondary | ICD-10-CM | POA: Diagnosis not present

## 2022-10-13 NOTE — Progress Notes (Signed)
This patient returns to the office for evaluation and treatment of long thick painful nails .  This patient is unable to trim his own nails since the patient cannot reach the feet.  Patient says the nails are painful walking and wearing his shoes. He also says he has a painful callus under the big toe joint right foot.  This callus is painful walking and wearing her shoes.   He returns for preventive foot care services.  Patient is diabetic on Januvia.  General Appearance  Alert, conversant and in no acute stress.  Vascular  Dorsalis pedis and posterior tibial  pulses are palpable  bilaterally.  Capillary return is within normal limits  bilaterally. Temperature is within normal limits  bilaterally.  Neurologic  Senn-Weinstein monofilament wire test within normal limits  bilaterally. Muscle power within normal limits bilaterally.  Nails Thick disfigured discolored nails with subungual debris  from hallux to fifth toes bilaterally. No evidence of bacterial infection or drainage bilaterally.  Orthopedic  No limitations of motion  feet .  No crepitus or effusions noted.  No bony pathology or digital deformities noted.  Skin  normotropic skin  noted bilaterally.  No signs of infections or ulcers noted.   Porokeratosis sub 1 right foot.  Onychomycosis  Pain in toes right foot  Pain in toes left foot.  Porokeratosis right foot  Debridement  of nails  1-5  B/L with a nail nipper.  Nails were then filed using a dremel tool with no incidents. . Debride callus right forefoot with # 15 blade.   RTC  10 weeks    Gardiner Barefoot DPM

## 2022-12-29 ENCOUNTER — Encounter: Payer: Self-pay | Admitting: Podiatry

## 2022-12-29 ENCOUNTER — Ambulatory Visit: Payer: Medicare Other | Admitting: Podiatry

## 2022-12-29 VITALS — BP 132/65 | HR 71

## 2022-12-29 DIAGNOSIS — E119 Type 2 diabetes mellitus without complications: Secondary | ICD-10-CM | POA: Diagnosis not present

## 2022-12-29 DIAGNOSIS — L608 Other nail disorders: Secondary | ICD-10-CM

## 2022-12-29 DIAGNOSIS — M79676 Pain in unspecified toe(s): Secondary | ICD-10-CM | POA: Diagnosis not present

## 2022-12-29 DIAGNOSIS — Q828 Other specified congenital malformations of skin: Secondary | ICD-10-CM

## 2022-12-29 DIAGNOSIS — B351 Tinea unguium: Secondary | ICD-10-CM | POA: Diagnosis not present

## 2022-12-29 NOTE — Progress Notes (Signed)
This patient returns to the office for evaluation and treatment of long thick painful nails .  This patient is unable to trim his own nails since the patient cannot reach the feet.  Patient says the nails are painful walking and wearing his shoes. He also says he has a painful callus under the big toe joint right foot.  This callus is painful walking and wearing her shoes.   He returns for preventive foot care services.  Patient is diabetic on Januvia.  General Appearance  Alert, conversant and in no acute stress.  Vascular  Dorsalis pedis and posterior tibial  pulses are palpable  bilaterally.  Capillary return is within normal limits  bilaterally. Temperature is within normal limits  bilaterally.  Neurologic  Senn-Weinstein monofilament wire test within normal limits  bilaterally. Muscle power within normal limits bilaterally.  Nails Thick disfigured discolored nails with subungual debris  from hallux to fifth toes bilaterally. No evidence of bacterial infection or drainage bilaterally.  Orthopedic  No limitations of motion  feet .  No crepitus or effusions noted.  No bony pathology or digital deformities noted.  Skin  normotropic skin  noted bilaterally.  No signs of infections or ulcers noted.   Porokeratosis sub 1 right foot.  Onychomycosis  Pain in toes right foot  Pain in toes left foot.  Porokeratosis right foot  Debridement  of nails  1-5  B/L with a nail nipper.  Nails were then filed using a dremel tool with no incidents. . Debride callus right forefoot with dremel tool.   RTC  10 weeks    Gardiner Barefoot DPM

## 2022-12-30 DIAGNOSIS — E1169 Type 2 diabetes mellitus with other specified complication: Secondary | ICD-10-CM | POA: Diagnosis not present

## 2022-12-30 DIAGNOSIS — E7800 Pure hypercholesterolemia, unspecified: Secondary | ICD-10-CM | POA: Diagnosis not present

## 2022-12-30 DIAGNOSIS — I10 Essential (primary) hypertension: Secondary | ICD-10-CM | POA: Diagnosis not present

## 2022-12-31 DIAGNOSIS — E781 Pure hyperglyceridemia: Secondary | ICD-10-CM | POA: Diagnosis not present

## 2022-12-31 DIAGNOSIS — E1169 Type 2 diabetes mellitus with other specified complication: Secondary | ICD-10-CM | POA: Diagnosis not present

## 2022-12-31 DIAGNOSIS — M1A09X0 Idiopathic chronic gout, multiple sites, without tophus (tophi): Secondary | ICD-10-CM | POA: Diagnosis not present

## 2022-12-31 DIAGNOSIS — E785 Hyperlipidemia, unspecified: Secondary | ICD-10-CM | POA: Diagnosis not present

## 2022-12-31 DIAGNOSIS — E1165 Type 2 diabetes mellitus with hyperglycemia: Secondary | ICD-10-CM | POA: Diagnosis not present

## 2022-12-31 DIAGNOSIS — I10 Essential (primary) hypertension: Secondary | ICD-10-CM | POA: Diagnosis not present

## 2022-12-31 DIAGNOSIS — M109 Gout, unspecified: Secondary | ICD-10-CM | POA: Diagnosis not present

## 2023-02-07 ENCOUNTER — Telehealth: Payer: Self-pay | Admitting: *Deleted

## 2023-02-07 ENCOUNTER — Encounter (HOSPITAL_COMMUNITY): Payer: Self-pay

## 2023-02-07 ENCOUNTER — Other Ambulatory Visit: Payer: Self-pay

## 2023-02-07 ENCOUNTER — Emergency Department (HOSPITAL_COMMUNITY)
Admission: EM | Admit: 2023-02-07 | Discharge: 2023-02-07 | Disposition: A | Discharge status: Home / Self Care | Payer: Medicare Other | Attending: Emergency Medicine | Admitting: Emergency Medicine

## 2023-02-07 ENCOUNTER — Emergency Department (HOSPITAL_COMMUNITY): Payer: Medicare Other

## 2023-02-07 DIAGNOSIS — M25511 Pain in right shoulder: Secondary | ICD-10-CM | POA: Diagnosis not present

## 2023-02-07 DIAGNOSIS — M109 Gout, unspecified: Secondary | ICD-10-CM | POA: Diagnosis not present

## 2023-02-07 DIAGNOSIS — M79641 Pain in right hand: Secondary | ICD-10-CM | POA: Diagnosis not present

## 2023-02-07 DIAGNOSIS — M25521 Pain in right elbow: Secondary | ICD-10-CM | POA: Diagnosis not present

## 2023-02-07 DIAGNOSIS — M130 Polyarthritis, unspecified: Secondary | ICD-10-CM | POA: Diagnosis not present

## 2023-02-07 MED ORDER — HYDROCODONE-ACETAMINOPHEN 5-325 MG PO TABS
1.0000 | ORAL_TABLET | ORAL | Status: AC
  Administered 2023-02-07: 1 via ORAL
  Filled 2023-02-07: qty 1

## 2023-02-07 MED ORDER — HYDROCODONE-ACETAMINOPHEN 5-325 MG PO TABS: 2.0000 | ORAL_TABLET | ORAL | 0 refills | Status: AC | PRN

## 2023-02-07 MED ORDER — KETOROLAC TROMETHAMINE 15 MG/ML IJ SOLN
15.0000 mg | Freq: Once | INTRAMUSCULAR | Status: DC
  Filled 2023-02-07: qty 1

## 2023-02-07 MED ORDER — KETOROLAC TROMETHAMINE 15 MG/ML IJ SOLN
15.0000 mg | Freq: Once | INTRAMUSCULAR | Status: AC
  Administered 2023-02-07: 15 mg via INTRAMUSCULAR

## 2023-02-07 NOTE — Telephone Encounter (Signed)
Pharmacy called related to Rx: VICODIN needing a max dose.  Pharmacy requesting max dose of 6 tablets per day be added to Rx.

## 2023-02-07 NOTE — Discharge Instructions (Addendum)
You were seen for your gout attack in the emergency department.   At home, please take Tylenol and or colchicine for your pain.  You may take the Norco we have prescribed you for any breakthrough pain that you have.  Do not take this medicine before driving or operating heavy machinery.  Follow-up with your primary doctor in 2-3 days regarding your visit.  Please also follow-up with rheumatology to get additional testing to see if something other than gout could be causing her symptoms.  Return immediately to the emergency department if you experience any of the following: Fevers, worsening pain, or any other concerning symptoms.    Thank you for visiting our Emergency Department. It was a pleasure taking care of you today.

## 2023-02-07 NOTE — ED Triage Notes (Signed)
Pt came in via POV for Rt shoulder/hand pain for the past 2 days. Does have Hx of gout & thanks that's what it is, denies any recent falls/injuries &  reports pain 10/10, A/Ox4.

## 2023-02-07 NOTE — ED Provider Notes (Signed)
[facility] Provider Note   CSN: [id number] Arrival date & time: 02/07/23  1055     History  Chief Complaint  Patient presents with   Rt hand pain    Geoffrey Castro is a 72 y.o. male.  72 year old male with history of gout and ulcers on omeprazole who presents to the emergency department with right upper extremity pain.  Patient states that 2 nights ago he thought that he slept on his arm wrong and started developing some pain.  Then started noticing swelling of his wrist and elbow along with worsening pain.  Did try taking his allopurinol and colchicine yesterday and took his colchicine today without significant improvement of his symptoms.  Also says that his right shoulder and right ankle have started to hurt.  Has a history of gout but reports that this is more severe than his typical gout attacks.  Has had elbow and big toe involvement before.  Denies any fevers.  No IV drug use.  No indwelling catheters.  No camping or tick exposure.       Home Medications Prior to Admission medications   Medication Sig Start Date End Date Taking? Authorizing Provider  HYDROcodone-acetaminophen (NORCO/VICODIN) 5-325 MG tablet Take 2 tablets by mouth every 4 (four) hours as needed. 02/07/23  Yes Fransico Meadow, MD  amLODipine (NORVASC) 10 MG tablet Take 10 mg by mouth daily.    [provider]  augmented betamethasone dipropionate (DIPROLENE-AF) 0.05 % cream APPLY CREAM TOPICALLY TO AFFECTED AREA UP TO TWICE DAILY AS NEEDED (DO NOT APPLY TO FACE GROIN OR UNDERAM) 05/18/19   [provider]  citalopram (CELEXA) 10 MG tablet Take 10 mg by mouth daily. 07/12/19   [provider]  diclofenac sodium (VOLTAREN) 1 % GEL  10/16/19   [provider]  docusate sodium (COLACE) 100 MG capsule Take 1 capsule (100 mg total) by mouth every 12 (twelve) hours. 10/11/16   Long, Wonda Olds, MD  etodolac (LODINE) 400 MG tablet Take  400 mg by mouth 2 (two) times daily. 12/21/19   [provider]  FLUAD 0.5 ML SUSY PHARMACIST ADMINISTERED IMMUNIZATION ADMINISTERED AT TIME OF DISPENSING 09/23/18   [provider]  FLUAD QUADRIVALENT 0.5 ML injection  09/18/19   [provider]  fluticasone (CUTIVATE) 0.05 % cream APPLY TO AFFECTED AREA TWICE DAILY 03/12/19   [provider]  GAVILYTE-G 236 g solution See admin instructions. 08/14/18   [provider]  omeprazole (PRILOSEC OTC) 20 MG tablet Take 20 mg by mouth daily.    [provider]  oxyCODONE-acetaminophen (PERCOCET/ROXICET) 5-325 MG tablet Take 1-2 tablets by mouth every 4 (four) hours as needed for severe pain. 10/11/16   Margette Fast, MD  PNEUMOVAX 23 25 MCG/0.5ML injection  09/18/19   [provider]  polyethylene glycol (MIRALAX / GLYCOLAX) packet Take 17 g by mouth daily. 10/11/16   Long, Wonda Olds, MD  sitaGLIPtin (JANUVIA) 100 MG tablet Take 100 mg by mouth daily.    [provider]  tamsulosin (FLOMAX) 0.4 MG CAPS capsule Take 1 capsule (0.4 mg total) by mouth daily. 10/11/16   Long, Wonda Olds, MD  temazepam (RESTORIL) 15 MG capsule TAKE 1 CAPSULE BY MOUTH EVERY DAY AT BEDTIME AS NEEDED FOR SLEEP 07/12/19   [provider]      Allergies    Codeine and Oxycodone    Review of Systems   Review of Systems  Physical Exam  Updated Vital Signs BP (!) 141/115   Pulse 93   Temp 98.2 F (36.8 C) (Oral)   Resp (!) 21   Ht 5' 9"$  (1.753 m)   Wt 104.3 kg   SpO2 99%   BMI 33.97 kg/m  Physical Exam Vitals and nursing note reviewed.  Constitutional:      General: He is not in acute distress.    Appearance: He is well-developed.  HENT:     Head: Normocephalic and atraumatic.     Right Ear: External ear normal.     Left Ear: External ear normal.     Nose: Nose normal.  Eyes:     Extraocular Movements: Extraocular movements intact.     Conjunctiva/sclera: Conjunctivae normal.     Pupils:  Pupils are equal, round, and reactive to light.  Cardiovascular:     Rate and Rhythm: Normal rate and regular rhythm.     Heart sounds: Normal heart sounds.  Pulmonary:     Effort: Pulmonary effort is normal. No respiratory distress.  Musculoskeletal:     Cervical back: Normal range of motion and neck supple.     Right lower leg: No edema.     Left lower leg: No edema.     Comments: Redness of right shoulder and right wrist with swelling.  Still has full passive range of motion without significant pain.  Full active range of motion of right shoulder.  No significant swelling of the right shoulder.  Minimal warmth of right wrist and right elbow.  Minimal swelling of right ankle.  No warmth.  Still has full range of motion.  Skin:    General: Skin is warm and dry.  Neurological:     Mental Status: He is alert. Mental status is at baseline.  Psychiatric:        Mood and Affect: Mood normal.        Behavior: Behavior normal.     ED Results / Procedures / Treatments   Labs (all labs ordered are listed, but only abnormal results are displayed) Labs Reviewed - No data to display  EKG None  Radiology DG Elbow 2 Views Right  Result Date: 02/07/2023 CLINICAL DATA:  Right elbow pain. EXAM: RIGHT ELBOW - 2 VIEW COMPARISON:  None Available. FINDINGS: No visible fracture or dislocation. There may be some joint fluid based on mildly prominent anterior fat pad. Underlying osteoarthritis present of the elbow joint. No bony lesions or destruction. IMPRESSION: Possible joint fluid. No visible fracture. Underlying osteoarthritis. Electronically Signed   By: Aletta Edouard M.D.   On: 02/07/2023 13:34   DG Hand Complete Right  Result Date: 02/07/2023 CLINICAL DATA:  Right hand pain. EXAM: RIGHT HAND - COMPLETE 3+ VIEW COMPARISON:  None Available. FINDINGS: No fracture or dislocation identified. Bones are osteopenic. No significant arthropathy. No bony lesions or destruction. Soft tissues are  unremarkable. IMPRESSION: Osteopenia. No acute findings. Electronically Signed   By: Aletta Edouard M.D.   On: 02/07/2023 13:30   DG Shoulder Right  Result Date: 02/07/2023 CLINICAL DATA:  Right shoulder pain. EXAM: RIGHT SHOULDER - 2+ VIEW COMPARISON:  None Available. FINDINGS: There is no evidence of fracture or dislocation. Mild degenerative disease of the [facility] joint without separation. No bony lesions or destruction. Soft tissues are unremarkable. IMPRESSION: Mild degenerative disease of the [facility] joint. Electronically Signed   By: Aletta Edouard M.D.   On: 02/07/2023 13:26    Procedures Procedures   Medications Ordered in ED Medications  HYDROcodone-acetaminophen (NORCO/VICODIN)  5-325 MG per tablet 1 tablet (1 tablet Oral Given 02/07/23 1348)  ketorolac (TORADOL) 15 MG/ML injection 15 mg (15 mg Intramuscular Given 02/07/23 1348)    ED Course/ Medical Decision Making/ A&P                             Medical Decision Making Amount and/or Complexity of Data Reviewed Radiology: ordered.  Risk Prescription drug management.   MONTEE LINDLY is a 72 y.o. male with comorbidities that complicate the patient evaluation including gout and ulcers on omeprazole who presents emergency department with right elbow and wrist pain  Initial Ddx:  Gout, inflammatory arthritis, septic arthritis  MDM:  Feel the patient's symptoms may be related to his gout.  Strange that he is having several joints involved but does not have any risk factors for septic arthritis.  Is still able to range these joints making septic arthritis highly unlikely.  Will obtain x-rays to evaluate for gross abnormality.  Plan:  X-ray Pain medication  ED Summary/Re-evaluation:  Patient was feeling improved after the Norco and Toradol that he was given.  X-rays did not show any acute abnormalities.  Will treat the patient presumptively for gout.  Will have him follow-up with his primary doctor as well as rheumatology to  ensure that he does not have another cause of inflammatory arthritis such as rheumatoid arthritis.  This patient presents to the ED for concern of complaints listed in HPI, this involves an extensive number of treatment options, and is a complaint that carries with it a high risk of complications and morbidity. Disposition including potential need for admission considered.   Dispo: DC Home. Return precautions discussed including, but not limited to, those listed in the AVS. Allowed pt time to ask questions which were answered fully prior to dc.  Records reviewed Outpatient Clinic Notes I independently reviewed the following imaging with scope of interpretation limited to determining acute life threatening conditions related to emergency care: Extremity x-ray(s) and agree with the radiologist interpretation with the following exceptions: none I have reviewed the patients home medications and made adjustments as needed Social Determinants of health:  Elderly  Final Clinical Impression(s) / ED Diagnoses Final diagnoses:  Polyarthritis  Acute gout of multiple sites, unspecified cause    Rx / DC Orders ED Discharge Orders          Ordered    HYDROcodone-acetaminophen (NORCO/VICODIN) 5-325 MG tablet  Every 4 hours PRN        02/07/23 1343              Fransico Meadow, MD 02/07/23 1622

## 2023-02-11 DIAGNOSIS — M109 Gout, unspecified: Secondary | ICD-10-CM | POA: Diagnosis not present

## 2023-02-11 DIAGNOSIS — Q782 Osteopetrosis: Secondary | ICD-10-CM | POA: Diagnosis not present

## 2023-02-11 DIAGNOSIS — I10 Essential (primary) hypertension: Secondary | ICD-10-CM | POA: Diagnosis not present

## 2023-02-11 DIAGNOSIS — E1169 Type 2 diabetes mellitus with other specified complication: Secondary | ICD-10-CM | POA: Diagnosis not present

## 2023-03-04 DIAGNOSIS — I10 Essential (primary) hypertension: Secondary | ICD-10-CM | POA: Diagnosis not present

## 2023-03-04 DIAGNOSIS — R2689 Other abnormalities of gait and mobility: Secondary | ICD-10-CM | POA: Diagnosis not present

## 2023-03-04 DIAGNOSIS — E1169 Type 2 diabetes mellitus with other specified complication: Secondary | ICD-10-CM | POA: Diagnosis not present

## 2023-03-04 DIAGNOSIS — M818 Other osteoporosis without current pathological fracture: Secondary | ICD-10-CM | POA: Diagnosis not present

## 2023-03-04 DIAGNOSIS — Z0001 Encounter for general adult medical examination with abnormal findings: Secondary | ICD-10-CM | POA: Diagnosis not present

## 2023-03-04 DIAGNOSIS — M109 Gout, unspecified: Secondary | ICD-10-CM | POA: Diagnosis not present

## 2023-03-16 ENCOUNTER — Ambulatory Visit: Payer: Medicare Other | Admitting: Podiatry

## 2023-03-16 ENCOUNTER — Encounter: Payer: Self-pay | Admitting: Podiatry

## 2023-03-16 VITALS — BP 140/86 | HR 93

## 2023-03-16 DIAGNOSIS — E119 Type 2 diabetes mellitus without complications: Secondary | ICD-10-CM

## 2023-03-16 DIAGNOSIS — Q828 Other specified congenital malformations of skin: Secondary | ICD-10-CM | POA: Diagnosis not present

## 2023-03-16 DIAGNOSIS — B351 Tinea unguium: Secondary | ICD-10-CM

## 2023-03-16 DIAGNOSIS — M79676 Pain in unspecified toe(s): Secondary | ICD-10-CM | POA: Diagnosis not present

## 2023-03-16 NOTE — Progress Notes (Signed)
This patient returns to the office for evaluation and treatment of long thick painful nails .  This patient is unable to trim his own nails since the patient cannot reach the feet.  Patient says the nails are painful walking and wearing his shoes. He also says he has a painful callus under the big toe joint right foot.  This callus is painful walking and wearing her shoes.   He returns for preventive foot care services.  Patient is diabetic on Januvia. ° °General Appearance  Alert, conversant and in no acute stress. ° °Vascular  Dorsalis pedis and posterior tibial  pulses are palpable  bilaterally.  Capillary return is within normal limits  bilaterally. Temperature is within normal limits  bilaterally. ° °Neurologic  Senn-Weinstein monofilament wire test within normal limits  bilaterally. Muscle power within normal limits bilaterally. ° °Nails Thick disfigured discolored nails with subungual debris  from hallux to fifth toes bilaterally. No evidence of bacterial infection or drainage bilaterally. ° °Orthopedic  No limitations of motion  feet .  No crepitus or effusions noted.  No bony pathology or digital deformities noted. ° °Skin  normotropic skin  noted bilaterally.  No signs of infections or ulcers noted.   Porokeratosis sub 1 right foot. ° °Onychomycosis  Pain in toes right foot  Pain in toes left foot.  Porokeratosis right foot ° °Debridement  of nails  1-5  B/L with a nail nipper.  Nails were then filed using a dremel tool with no incidents. . Debride callus right forefoot with # 15 blade.   RTC  10 weeks  ° ° °Keanen Dohse DPM  °

## 2023-04-28 DIAGNOSIS — E1169 Type 2 diabetes mellitus with other specified complication: Secondary | ICD-10-CM | POA: Diagnosis not present

## 2023-04-28 DIAGNOSIS — I10 Essential (primary) hypertension: Secondary | ICD-10-CM | POA: Diagnosis not present

## 2023-04-28 DIAGNOSIS — E785 Hyperlipidemia, unspecified: Secondary | ICD-10-CM | POA: Diagnosis not present

## 2023-04-28 DIAGNOSIS — M1000 Idiopathic gout, unspecified site: Secondary | ICD-10-CM | POA: Diagnosis not present

## 2023-04-29 DIAGNOSIS — R2689 Other abnormalities of gait and mobility: Secondary | ICD-10-CM | POA: Diagnosis not present

## 2023-04-29 DIAGNOSIS — M109 Gout, unspecified: Secondary | ICD-10-CM | POA: Diagnosis not present

## 2023-05-25 ENCOUNTER — Encounter: Payer: Self-pay | Admitting: Podiatry

## 2023-05-25 ENCOUNTER — Ambulatory Visit: Payer: Medicare Other | Admitting: Podiatry

## 2023-05-25 DIAGNOSIS — B351 Tinea unguium: Secondary | ICD-10-CM | POA: Diagnosis not present

## 2023-05-25 DIAGNOSIS — Q828 Other specified congenital malformations of skin: Secondary | ICD-10-CM

## 2023-05-25 DIAGNOSIS — E119 Type 2 diabetes mellitus without complications: Secondary | ICD-10-CM

## 2023-05-25 DIAGNOSIS — M79676 Pain in unspecified toe(s): Secondary | ICD-10-CM | POA: Diagnosis not present

## 2023-05-25 NOTE — Progress Notes (Signed)
This patient returns to the office for evaluation and treatment of long thick painful nails .  This patient is unable to trim his own nails since the patient cannot reach the feet.  Patient says the nails are painful walking and wearing his shoes. He also says he has a painful callus under the big toe joint right foot.  This callus is painful walking and wearing her shoes.   He returns for preventive foot care services.  Patient is diabetic on Januvia.  General Appearance  Alert, conversant and in no acute stress.  Vascular  Dorsalis pedis and posterior tibial  pulses are palpable  bilaterally.  Capillary return is within normal limits  bilaterally. Temperature is within normal limits  bilaterally.  Neurologic  Senn-Weinstein monofilament wire test within normal limits  bilaterally. Muscle power within normal limits bilaterally.  Nails Thick disfigured discolored nails with subungual debris  from hallux to fifth toes bilaterally. No evidence of bacterial infection or drainage bilaterally.  Orthopedic  No limitations of motion  feet .  No crepitus or effusions noted.  No bony pathology or digital deformities noted.  Skin  normotropic skin  noted bilaterally.  No signs of infections or ulcers noted.   Porokeratosis sub 1 right foot.  Onychomycosis  Pain in toes right foot  Pain in toes left foot.  Porokeratosis right foot  Debridement  of nails  1-5  B/L with a nail nipper.  Nails were then filed using a dremel tool with no incidents. . Debride callus as a courtesy.   RTC  10 weeks    Helane Gunther DPM

## 2023-06-02 ENCOUNTER — Other Ambulatory Visit: Payer: Self-pay

## 2023-06-02 ENCOUNTER — Ambulatory Visit: Payer: Medicare Other | Attending: Family Medicine

## 2023-06-02 DIAGNOSIS — R2689 Other abnormalities of gait and mobility: Secondary | ICD-10-CM | POA: Insufficient documentation

## 2023-06-02 DIAGNOSIS — R2681 Unsteadiness on feet: Secondary | ICD-10-CM | POA: Insufficient documentation

## 2023-06-02 NOTE — Therapy (Signed)
OUTPATIENT PHYSICAL THERAPY LOWER EXTREMITY EVALUATION   Patient Name: Geoffrey Castro MRN: [id number] DOB:02/04/1951, 72 y.o., male Today's Date: 06/02/2023  END OF SESSION:  PT End of Session - 06/02/23 1347     Visit Number 1    Number of Visits 9    Date for PT Re-Evaluation 07/28/23    Authorization Type [employer]    PT Start Time 1000    PT Stop Time 1035    PT Time Calculation (min) 35 min    Activity Tolerance Patient tolerated treatment well    Behavior During Therapy WFL for tasks assessed/performed             Past Medical History:  Diagnosis Date   Diabetes mellitus without complication (HCC)    Gout    History of kidney stones    Hypertension    Osteoporosis    Reflux    Past Surgical History:  Procedure Laterality Date   CYSTOSCOPY W/ URETERAL STENT PLACEMENT Left 10/21/2016   Procedure: CYSTOSCOPY WITH LEFT RETROGRADE PYELOGRAM,LEFT URETEROSCOPY;  Surgeon: Heloise Purpura, MD;  Location: [facility];  Service: Urology;  Laterality: Left;   LITHOTRIPSY     Patient Active Problem List   Diagnosis Date Noted   Porokeratosis 02/08/2022    PCP: Renaye Rakers, MD  REFERRING PROVIDER: Renaye Rakers, MD  REFERRING DIAG: R26.89 (ICD-10-CM) - Other abnormalities of gait and mobility   THERAPY DIAG:  Unsteadiness on feet - Plan: PT plan of care cert/re-cert  Other abnormalities of gait and mobility - Plan: PT plan of care cert/re-cert  Rationale for Evaluation and Treatment: Rehabilitation  ONSET DATE: Chronic  SUBJECTIVE:   SUBJECTIVE STATEMENT: Pt presents to PT with reports of impaired balance and gait and general feeling of unsteadiness/fear of fall. He denies falls over last few months, feels very unsteady when stepping onto or off of curbs. He would like to improve his balance and walking to decrease fall risk.   PERTINENT HISTORY: DM, Osteoporosis   PAIN:  Are you having pain?  No: NPRS scale: 0/10  PRECAUTIONS: Fall  WEIGHT BEARING  RESTRICTIONS: No  FALLS:  Has patient fallen in last 6 months? 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[employer]   PLOF: Independent  PATIENT GOALS: improve his walking and balance  OBJECTIVE:   DIAGNOSTIC FINDINGS:   N/A  PATIENT SURVEYS:  FOTO: 55% function; 62% predicted   COGNITION: Overall cognitive status: Within functional limits for tasks assessed     SENSATION: WFL  POSTURE: rounded shoulders, forward head, and flexed trunk   PALPATION: No TTP noted  LOWER EXTREMITY MMT:  MMT Right eval Left eval  Hip flexion 4/5 5/5  Hip extension    Hip abduction 4/5 5/5  Hip adduction 5/5 5/5  Hip internal rotation    Hip external rotation    Knee flexion    Knee extension    Ankle dorsiflexion    Ankle plantarflexion    Ankle inversion    Ankle eversion     (Blank rows = not tested)  LOWER EXTREMITY SPECIAL TESTS:  DNT  FUNCTIONAL TESTS:  30 Second Sit to Stand: 9 reps TUG: 12 seconds with no AD Tandem stance: 5 seconds SLS: R - 3 seconds; L - 2 seconds   GAIT: Distance walked: 52ft Assistive device utilized: None Level of  assistance: Complete Independence Comments: wide BoS; decreased gait speed  TREATMENT: [facility] Adult PT Treatment:                                                DATE: 06/02/2023 Therapeutic Exercise: STS x 10 Tandem stance x 30" each Standing march x 10 each  PATIENT EDUCATION:  Education details: eval findings, FOTO, HEP, POC Person educated: Patient Education method: Explanation, Demonstration, and Handouts Education comprehension: verbalized understanding and returned demonstration  HOME EXERCISE PROGRAM: Access Code: [email or web address] URL: [email or web address] Date: 06/02/2023 Prepared by: Edwinna Areola  Exercises - Sit to Stand   - 2 x daily - 7 x weekly - 2 sets - 10 reps - Standing March with Counter Support  - 2 x daily - 7 x weekly - 2 sets - 10 reps - Standing Tandem Balance with Counter Support  - 2 x daily - 7 x weekly - 2-3 reps - 30 sec hold  ASSESSMENT:  CLINICAL IMPRESSION: Patient is a 72 y.o. M who was seen today for physical therapy evaluation and treatment for reports of impaired balance and gait and general feeling of unsteadiness/fear of fall. Physical findings are consistent with MD impression as pt subjective complaints, as pt demonstrates decrease in narrow BoS balance and functional mobility. His FOTO score shows he is operating below baseline PLOF. He would benefit from skilled PT services working on improving gait and balance in order to decrease fall risk with community activities.   OBJECTIVE IMPAIRMENTS: Abnormal gait, decreased activity tolerance, decreased balance, decreased endurance, decreased mobility, difficulty walking, and decreased strength.   ACTIVITY LIMITATIONS: carrying, lifting, standing, squatting, stairs, transfers, and locomotion level  PARTICIPATION LIMITATIONS: driving, shopping, community activity, occupation, and yard work  PERSONAL FACTORS: Fitness, Time since onset of injury/illness/exacerbation, and 1-2 comorbidities: DM, Osteoporosis   are also affecting patient's functional outcome.   REHAB POTENTIAL: Excellent  CLINICAL DECISION MAKING: Evolving/moderate complexity  EVALUATION COMPLEXITY: Moderate   GOALS: Goals reviewed with patient? No  SHORT TERM GOALS: Target date: 06/23/2023   Pt will be compliant and knowledgeable with initial HEP for improved comfort and carryover Baseline: initial HEP given  Goal status: INITIAL  2.  Pt will be able to hold tandem stance for at least 25 seconds for improved balance with narrow BoS and decreased fall risk Baseline: 5 seconds Goal status: INITIAL  LONG TERM GOALS: Target date: 07/28/2023   Pt will improve FOTO  function score to no less than 62% as proxy for functional improvement Baseline: 55% function Goal status: INITIAL   2.  Pt will increase 30 Second Sit to Stand rep count to no less than 11 reps for improved balance, strength, and functional mobility Baseline: 9 reps  Goal status: INITIAL   3.  Pt will improve bilateral SLS time to no less than 25 seconds for improved balance and decreased fall risk Baseline: R - 3 seconds; L - 2 seconds  Goal status: INITIAL  4.  Pt will be able to ambulate up/down curb without need for UE assist or LoB for improved safety and decreased fall risk Baseline: unable Goal status: INITIAL   PLAN:  PT FREQUENCY: 1x/week  PT DURATION: 8 weeks  PLANNED INTERVENTIONS: Therapeutic exercises, Therapeutic activity, Neuromuscular re-education, Balance training, Gait training, Patient/Family education, Self Care, Joint mobilization, Manual therapy, and Re-evaluation  PLAN FOR NEXT SESSION:  assess HEP response, narrow BoS balance, obstacle challenges, curb navigation   Eloy End, PT 06/02/2023, 3:08 PM

## 2023-06-03 DIAGNOSIS — U071 COVID-19: Secondary | ICD-10-CM | POA: Diagnosis not present

## 2023-06-03 DIAGNOSIS — M130 Polyarthritis, unspecified: Secondary | ICD-10-CM | POA: Diagnosis not present

## 2023-06-03 DIAGNOSIS — E1169 Type 2 diabetes mellitus with other specified complication: Secondary | ICD-10-CM | POA: Diagnosis not present

## 2023-06-03 DIAGNOSIS — E118 Type 2 diabetes mellitus with unspecified complications: Secondary | ICD-10-CM | POA: Diagnosis not present

## 2023-06-03 DIAGNOSIS — M818 Other osteoporosis without current pathological fracture: Secondary | ICD-10-CM | POA: Diagnosis not present

## 2023-06-03 DIAGNOSIS — L309 Dermatitis, unspecified: Secondary | ICD-10-CM | POA: Diagnosis not present

## 2023-06-08 ENCOUNTER — Ambulatory Visit: Payer: Medicare Other

## 2023-06-13 NOTE — Therapy (Unsigned)
OUTPATIENT PHYSICAL THERAPY LOWER EXTREMITY EVALUATION   Patient Name: Geoffrey Castro MRN: [id number] DOB:01/16/1951, 72 y.o., male Today's Date: 06/15/2023  END OF SESSION:  PT End of Session - 06/15/23 1612     Visit Number 2    Number of Visits 9    Date for PT Re-Evaluation 07/28/23    Authorization Type [employer]    PT Start Time 1615    PT Stop Time 1655    PT Time Calculation (min) 40 min    Activity Tolerance Patient tolerated treatment well    Behavior During Therapy WFL for tasks assessed/performed              Past Medical History:  Diagnosis Date   Diabetes mellitus without complication (HCC)    Gout    History of kidney stones    Hypertension    Osteoporosis    Reflux    Past Surgical History:  Procedure Laterality Date   CYSTOSCOPY W/ URETERAL STENT PLACEMENT Left 10/21/2016   Procedure: CYSTOSCOPY WITH LEFT RETROGRADE PYELOGRAM,LEFT URETEROSCOPY;  Surgeon: Heloise Purpura, MD;  Location: [facility];  Service: Urology;  Laterality: Left;   LITHOTRIPSY     Patient Active Problem List   Diagnosis Date Noted   Porokeratosis 02/08/2022    PCP: Renaye Rakers, MD  REFERRING PROVIDER: Renaye Rakers, MD  REFERRING DIAG: R26.89 (ICD-10-CM) - Other abnormalities of gait and mobility   THERAPY DIAG:  Unsteadiness on feet  Other abnormalities of gait and mobility  Rationale for Evaluation and Treatment: Rehabilitation  ONSET DATE: Chronic  SUBJECTIVE:   SUBJECTIVE STATEMENT: Pt presents to PT with reports of impaired balance and gait and general feeling of unsteadiness/fear of fall. He denies falls over last few months, feels very unsteady when stepping onto or off of curbs. He would like to improve his balance and walking to decrease fall risk.   PERTINENT HISTORY: DM, Osteoporosis   PAIN:  Are you having pain?  No: NPRS scale: 0/10  PRECAUTIONS: Fall  WEIGHT BEARING RESTRICTIONS: No  FALLS:  Has patient fallen in last 6 months? 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[employer]   PLOF: Independent  PATIENT GOALS: improve his walking and balance  OBJECTIVE:   DIAGNOSTIC FINDINGS:   N/A  PATIENT SURVEYS:  FOTO: 55% function; 62% predicted   COGNITION: Overall cognitive status: Within functional limits for tasks assessed     SENSATION: WFL  POSTURE: rounded shoulders, forward head, and flexed trunk   PALPATION: No TTP noted  LOWER EXTREMITY MMT:  MMT Right eval Left eval  Hip flexion 4/5 5/5  Hip extension    Hip abduction 4/5 5/5  Hip adduction 5/5 5/5  Hip internal rotation    Hip external rotation    Knee flexion    Knee extension    Ankle dorsiflexion    Ankle plantarflexion    Ankle inversion    Ankle eversion     (Blank rows = not tested)  LOWER EXTREMITY SPECIAL TESTS:  DNT  FUNCTIONAL TESTS:  30 Second Sit to Stand: 9 reps TUG: 12 seconds with no AD Tandem stance: 5 seconds SLS: R - 3 seconds; L - 2 seconds   GAIT: Distance walked: 61ft Assistive device utilized: None Level of assistance: Complete Independence Comments: wide BoS; decreased gait speed  TREATMENT: [facility] Adult  PT Treatment:                                                DATE: 06/15/23 Therapeutic Exercise: Nustep L2 6 min Slant board gastroc stretch 30s x2 Supine bridge 2x10 SLR 2x10 B Supine hip fallouts GTB 10x B 10/10 unilaterally STS 5x w/o UE support Standing PF 10x  Neuromuscular re-ed:   06/15/23 0001  Berg Balance Test  Sit to Stand 3  Standing Unsupported 4  Sitting with Back Unsupported but Feet Supported on Floor or Stool 4  Stand to Sit 4  Transfers 3  Standing Unsupported with Eyes Closed 4  Standing Unsupported with Feet Together 4  From Standing, Reach Forward with Outstretched Arm 4  From Standing  Position, Pick up Object from Floor 4  From Standing Position, Turn to Look Behind Over each Shoulder 4  Turn 360 Degrees 2  Standing Unsupported, Alternately Place Feet on Step/Stool 4  Standing Unsupported, One Foot in Front 3  Standing on One Leg 2  Total Score 49    [facility] Adult PT Treatment:                                                DATE: 06/02/2023 Therapeutic Exercise: STS x 10 Tandem stance x 30" each Standing march x 10 each  PATIENT EDUCATION:  Education details: eval findings, FOTO, HEP, POC Person educated: Patient Education method: Explanation, Demonstration, and Handouts Education comprehension: verbalized understanding and returned demonstration  HOME EXERCISE PROGRAM: Access Code: [email or web address] URL: [email or web address] Date: 06/02/2023 Prepared by: Edwinna Areola  Exercises - Sit to Stand  - 2 x daily - 7 x weekly - 2 sets - 10 reps - Standing March with Counter Support  - 2 x daily - 7 x weekly - 2 sets - 10 reps - Standing Tandem Balance with Counter Support  - 2 x daily - 7 x weekly - 2-3 reps - 30 sec hold  ASSESSMENT:  CLINICAL IMPRESSION: Focus of today was assessment of BBT scoring 49/56 indicating good static balance.  Incorporated stretching of BLEs into session as well as ankle strategies.  Able to complete all tasks and reps but did requires 2 rest breaks.   Patient is a 72 y.o. M who was seen today for physical therapy evaluation and treatment for reports of impaired balance and gait and general feeling of unsteadiness/fear of fall. Physical findings are consistent with MD impression as pt subjective complaints, as pt demonstrates decrease in narrow BoS balance and functional mobility. His FOTO score shows he is operating below baseline PLOF. He would benefit from skilled PT services working on improving gait and balance in order to decrease fall risk with community activities.   OBJECTIVE IMPAIRMENTS: Abnormal gait, decreased activity  tolerance, decreased balance, decreased endurance, decreased mobility, difficulty walking, and decreased strength.   ACTIVITY LIMITATIONS: carrying, lifting, standing, squatting, stairs, transfers, and locomotion level  PARTICIPATION LIMITATIONS: driving, shopping, community activity, occupation, and yard work  PERSONAL FACTORS: Fitness, Time since onset of injury/illness/exacerbation, and 1-2 comorbidities: DM, Osteoporosis   are also affecting patient's functional outcome.   REHAB POTENTIAL: Excellent  CLINICAL DECISION MAKING: Evolving/moderate complexity  EVALUATION COMPLEXITY: Moderate   GOALS: Goals reviewed with  patient? No  SHORT TERM GOALS: Target date: 06/23/2023   Pt will be compliant and knowledgeable with initial HEP for improved comfort and carryover Baseline: initial HEP given  Goal status: INITIAL  2.  Pt will be able to hold tandem stance for at least 25 seconds for improved balance with narrow BoS and decreased fall risk Baseline: 5 seconds Goal status: INITIAL  LONG TERM GOALS: Target date: 07/28/2023   Pt will improve FOTO function score to no less than 62% as proxy for functional improvement Baseline: 55% function Goal status: INITIAL   2.  Pt will increase 30 Second Sit to Stand rep count to no less than 11 reps for improved balance, strength, and functional mobility Baseline: 9 reps  Goal status: INITIAL   3.  Pt will improve bilateral SLS time to no less than 25 seconds for improved balance and decreased fall risk Baseline: R - 3 seconds; L - 2 seconds  Goal status: INITIAL  4.  Pt will be able to ambulate up/down curb without need for UE assist or LoB for improved safety and decreased fall risk Baseline: unable Goal status: INITIAL   PLAN:  PT FREQUENCY: 1x/week  PT DURATION: 8 weeks  PLANNED INTERVENTIONS: Therapeutic exercises, Therapeutic activity, Neuromuscular re-education, Balance training, Gait training, Patient/Family education, Self  Care, Joint mobilization, Manual therapy, and Re-evaluation  PLAN FOR NEXT SESSION: assess HEP response, narrow BoS balance, obstacle challenges, curb navigation   Hildred Laser, PT 06/15/2023, 4:59 PM

## 2023-06-15 ENCOUNTER — Ambulatory Visit: Payer: Medicare Other

## 2023-06-15 DIAGNOSIS — R2681 Unsteadiness on feet: Secondary | ICD-10-CM | POA: Diagnosis not present

## 2023-06-15 DIAGNOSIS — R2689 Other abnormalities of gait and mobility: Secondary | ICD-10-CM | POA: Diagnosis not present

## 2023-06-22 ENCOUNTER — Ambulatory Visit: Payer: Medicare Other | Attending: Family Medicine

## 2023-06-22 DIAGNOSIS — R2689 Other abnormalities of gait and mobility: Secondary | ICD-10-CM | POA: Diagnosis not present

## 2023-06-22 DIAGNOSIS — R2681 Unsteadiness on feet: Secondary | ICD-10-CM | POA: Insufficient documentation

## 2023-06-22 NOTE — Therapy (Addendum)
OUTPATIENT PHYSICAL THERAPY TREATMENT NOTE   Patient Name: Geoffrey Castro MRN: [id number] DOB:1951/10/06, 72 y.o., male Today's Date: 06/22/2023  END OF SESSION:  PT End of Session - 06/22/23 1352     Visit Number 3    Number of Visits 9    Date for PT Re-Evaluation 07/28/23    Authorization Type [employer]    PT Start Time 1400    PT Stop Time 1438    PT Time Calculation (min) 38 min    Activity Tolerance Patient tolerated treatment well    Behavior During Therapy WFL for tasks assessed/performed               Past Medical History:  Diagnosis Date   Diabetes mellitus without complication (HCC)    Gout    History of kidney stones    Hypertension    Osteoporosis    Reflux    Past Surgical History:  Procedure Laterality Date   CYSTOSCOPY W/ URETERAL STENT PLACEMENT Left 10/21/2016   Procedure: CYSTOSCOPY WITH LEFT RETROGRADE PYELOGRAM,LEFT URETEROSCOPY;  Surgeon: Heloise Purpura, MD;  Location: [facility];  Service: Urology;  Laterality: Left;   LITHOTRIPSY     Patient Active Problem List   Diagnosis Date Noted   Porokeratosis 02/08/2022    PCP: Renaye Rakers, MD  REFERRING PROVIDER: Renaye Rakers, MD  REFERRING DIAG: R26.89 (ICD-10-CM) - Other abnormalities of gait and mobility   THERAPY DIAG:  Unsteadiness on feet  Other abnormalities of gait and mobility  Rationale for Evaluation and Treatment: Rehabilitation  ONSET DATE: Chronic  SUBJECTIVE:   SUBJECTIVE STATEMENT: Pt presents to PT with reports of R ankle pain after doing some yard work, denies fall or sprain. Has been compliant with HEP with no adverse effect.    PERTINENT HISTORY: DM, Osteoporosis   PAIN:  Are you having pain?  No: NPRS scale: 0/10  PRECAUTIONS: Fall  WEIGHT BEARING RESTRICTIONS: No  FALLS:  Has patient fallen in last 6 months? 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[employer]   PLOF: Independent  PATIENT GOALS: improve his walking and balance  OBJECTIVE:   DIAGNOSTIC FINDINGS:   N/A  PATIENT SURVEYS:  FOTO: 55% function; 62% predicted   COGNITION: Overall cognitive status: Within functional limits for tasks assessed     SENSATION: WFL  POSTURE: rounded shoulders, forward head, and flexed trunk   PALPATION: No TTP noted  LOWER EXTREMITY MMT:  MMT Right eval Left eval  Hip flexion 4/5 5/5  Hip extension    Hip abduction 4/5 5/5  Hip adduction 5/5 5/5  Hip internal rotation    Hip external rotation    Knee flexion    Knee extension    Ankle dorsiflexion    Ankle plantarflexion    Ankle inversion    Ankle eversion     (Blank rows = not tested)  LOWER EXTREMITY SPECIAL TESTS:  DNT  FUNCTIONAL TESTS:  30 Second Sit to Stand: 9 reps TUG: 12 seconds with no AD Tandem stance: 5 seconds SLS: R - 3 seconds; L - 2 seconds   GAIT: Distance walked: 51ft Assistive device utilized: None Level of assistance: Complete Independence Comments: wide BoS; decreased gait speed  TREATMENT: [facility] Adult PT Treatment:  DATE: 06/22/23 Therapeutic Exercise: Nustep L4 x 4 min while taking subjective  Step up x 10 each 8in B UE LAQ 2x10 2# Supine SLR 2x10 each Bridge 2x10 Supine clamshell 2x15 GTB Neuromuscular re-ed: Tandem x 30" each Tandem walk x 2 laps in // Walking march in // x 2 laps FT EC on foam 2x30"  [facility] Adult PT Treatment:                                                DATE: 06/15/23 Therapeutic Exercise: Nustep L2 5 min Slant board gastroc stretch 30s x2 Supine bridge 2x10 SLR 2x10 B Supine hip fallouts GTB 10x B 10/10 unilaterally STS 5x w/o UE support Standing PF 10x Neuromuscular re-ed:   06/15/23 0001  Berg Balance Test  Sit to Stand 3  Standing Unsupported 4  Sitting with Back  Unsupported but Feet Supported on Floor or Stool 4  Stand to Sit 4  Transfers 3  Standing Unsupported with Eyes Closed 4  Standing Unsupported with Feet Together 4  From Standing, Reach Forward with Outstretched Arm 4  From Standing Position, Pick up Object from Floor 4  From Standing Position, Turn to Look Behind Over each Shoulder 4  Turn 360 Degrees 2  Standing Unsupported, Alternately Place Feet on Step/Stool 4  Standing Unsupported, One Foot in Front 3  Standing on One Leg 2  Total Score 49    [facility] Adult PT Treatment:                                                DATE: 06/02/2023 Therapeutic Exercise: STS x 10 Tandem stance x 30" each Standing march x 10 each  PATIENT EDUCATION:  Education details: eval findings, FOTO, HEP, POC Person educated: Patient Education method: Explanation, Demonstration, and Handouts Education comprehension: verbalized understanding and returned demonstration  HOME EXERCISE PROGRAM: Access Code: [email or web address] URL: [email or web address] Date: 06/02/2023 Prepared by: Edwinna Areola  Exercises - Sit to Stand  - 2 x daily - 7 x weekly - 2 sets - 10 reps - Standing March with Counter Support  - 2 x daily - 7 x weekly - 2 sets - 10 reps - Standing Tandem Balance with Counter Support  - 2 x daily - 7 x weekly - 2-3 reps - 30 sec hold  ASSESSMENT:  CLINICAL IMPRESSION: Pt was able to complete all prescribed exercises with no adverse effect, just slight increase in fatigue. Therapy focused on LE strengthening and improving balance. Pt continues to benefit from skilled PT, will continue per POC.    Patient is a 72 y.o. M who was seen today for physical therapy evaluation and treatment for reports of impaired balance and gait and general feeling of unsteadiness/fear of fall. Physical findings are consistent with MD impression as pt subjective complaints, as pt demonstrates decrease in narrow BoS balance and functional mobility. His FOTO score  shows he is operating below baseline PLOF. He would benefit from skilled PT services working on improving gait and balance in order to decrease fall risk with community activities.   OBJECTIVE IMPAIRMENTS: Abnormal gait, decreased activity tolerance, decreased balance, decreased endurance, decreased mobility, difficulty walking, and decreased strength.   ACTIVITY LIMITATIONS: carrying, lifting, standing, squatting,  stairs, transfers, and locomotion level  PARTICIPATION LIMITATIONS: driving, shopping, community activity, occupation, and yard work  PERSONAL FACTORS: Fitness, Time since onset of injury/illness/exacerbation, and 1-2 comorbidities: DM, Osteoporosis   are also affecting patient's functional outcome.   REHAB POTENTIAL: Excellent  CLINICAL DECISION MAKING: Evolving/moderate complexity  EVALUATION COMPLEXITY: Moderate   GOALS: Goals reviewed with patient? No  SHORT TERM GOALS: Target date: 06/23/2023   Pt will be compliant and knowledgeable with initial HEP for improved comfort and carryover Baseline: initial HEP given  Goal status: INITIAL  2.  Pt will be able to hold tandem stance for at least 25 seconds for improved balance with narrow BoS and decreased fall risk Baseline: 5 seconds Goal status: INITIAL  LONG TERM GOALS: Target date: 07/28/2023   Pt will improve FOTO function score to no less than 62% as proxy for functional improvement Baseline: 55% function Goal status: INITIAL   2.  Pt will increase 30 Second Sit to Stand rep count to no less than 11 reps for improved balance, strength, and functional mobility Baseline: 9 reps  Goal status: INITIAL   3.  Pt will improve bilateral SLS time to no less than 25 seconds for improved balance and decreased fall risk Baseline: R - 3 seconds; L - 2 seconds  Goal status: INITIAL  4.  Pt will be able to ambulate up/down curb without need for UE assist or LoB for improved safety and decreased fall risk Baseline:  unable Goal status: INITIAL   PLAN:  PT FREQUENCY: 1x/week  PT DURATION: 8 weeks  PLANNED INTERVENTIONS: Therapeutic exercises, Therapeutic activity, Neuromuscular re-education, Balance training, Gait training, Patient/Family education, Self Care, Joint mobilization, Manual therapy, and Re-evaluation  PLAN FOR NEXT SESSION: assess HEP response, narrow BoS balance, obstacle challenges, curb navigation   Eloy End, PT 06/22/2023, 2:38 PM

## 2023-06-28 NOTE — Therapy (Signed)
OUTPATIENT PHYSICAL THERAPY TREATMENT NOTE   Patient Name: Geoffrey Castro MRN: [id number] DOB:07/08/1951, 72 y.o., male Today's Date: 06/28/2023  END OF SESSION:      Past Medical History:  Diagnosis Date   Diabetes mellitus without complication (HCC)    Gout    History of kidney stones    Hypertension    Osteoporosis    Reflux    Past Surgical History:  Procedure Laterality Date   CYSTOSCOPY W/ URETERAL STENT PLACEMENT Left 10/21/2016   Procedure: CYSTOSCOPY WITH LEFT RETROGRADE PYELOGRAM,LEFT URETEROSCOPY;  Surgeon: Heloise Purpura, MD;  Location: [facility];  Service: Urology;  Laterality: Left;   LITHOTRIPSY     Patient Active Problem List   Diagnosis Date Noted   Porokeratosis 02/08/2022    PCP: Renaye Rakers, MD  REFERRING PROVIDER: Renaye Rakers, MD  REFERRING DIAG: R26.89 (ICD-10-CM) - Other abnormalities of gait and mobility   THERAPY DIAG:  No diagnosis found.  Rationale for Evaluation and Treatment: Rehabilitation  ONSET DATE: Chronic  SUBJECTIVE:   SUBJECTIVE STATEMENT: Pt presents to PT with reports of R ankle pain after doing some yard work, denies fall or sprain. Has been compliant with HEP with no adverse effect.    PERTINENT HISTORY: DM, Osteoporosis   PAIN:  Are you having pain?  No: NPRS scale: 0/10  PRECAUTIONS: Fall  WEIGHT BEARING RESTRICTIONS: No  FALLS:  Has patient fallen in last 6 months? 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[employer]   PLOF: Independent  PATIENT GOALS: improve his walking and balance  OBJECTIVE:   DIAGNOSTIC FINDINGS:   N/A  PATIENT SURVEYS:  FOTO: 55% function; 62% predicted   COGNITION: Overall cognitive status: Within functional limits for tasks assessed     SENSATION: WFL  POSTURE: rounded shoulders,  forward head, and flexed trunk   PALPATION: No TTP noted  LOWER EXTREMITY MMT:  MMT Right eval Left eval  Hip flexion 4/5 5/5  Hip extension    Hip abduction 4/5 5/5  Hip adduction 5/5 5/5  Hip internal rotation    Hip external rotation    Knee flexion    Knee extension    Ankle dorsiflexion    Ankle plantarflexion    Ankle inversion    Ankle eversion     (Blank rows = not tested)  LOWER EXTREMITY SPECIAL TESTS:  DNT  FUNCTIONAL TESTS:  30 Second Sit to Stand: 9 reps TUG: 12 seconds with no AD Tandem stance: 5 seconds SLS: R - 3 seconds; L - 2 seconds   GAIT: Distance walked: 35ft Assistive device utilized: None Level of assistance: Complete Independence Comments: wide BoS; decreased gait speed  TREATMENT: [facility] Adult PT Treatment:                                                DATE: 06/29/23 Therapeutic Exercise: Nustep L4 x 4 min while taking subjective  Step up x 10 each 8in B UE LAQ 2x10 2# Supine SLR 2x10 each Bridge 2x10 Supine clamshell 2x15 GTB Neuromuscular re-ed: Tandem x 30" each Tandem walk x 2 laps in // Walking march in // x 2 laps FT EC on foam 2x30"  [facility] Adult PT Treatment:  DATE: 06/22/23 Therapeutic Exercise: Nustep L4 x 4 min while taking subjective  Step up x 10 each 8in B UE LAQ 2x10 2# Supine SLR 2x10 each Bridge 2x10 Supine clamshell 2x15 GTB Neuromuscular re-ed: Tandem x 30" each Tandem walk x 2 laps in // Walking march in // x 2 laps FT EC on foam 2x30"  [facility] Adult PT Treatment:                                                DATE: 06/15/23 Therapeutic Exercise: Nustep L2 5 min Slant board gastroc stretch 30s x2 Supine bridge 2x10 SLR 2x10 B Supine hip fallouts GTB 10x B 10/10 unilaterally STS 5x w/o UE support Standing PF 10x  PATIENT EDUCATION:  Education details: eval findings, FOTO, HEP, POC Person educated: Patient Education method: Explanation, Demonstration, and  Handouts Education comprehension: verbalized understanding and returned demonstration  HOME EXERCISE PROGRAM: Access Code: [email or web address] URL: [email or web address] Date: 06/02/2023 Prepared by: Edwinna Areola  Exercises - Sit to Stand  - 2 x daily - 7 x weekly - 2 sets - 10 reps - Standing March with Counter Support  - 2 x daily - 7 x weekly - 2 sets - 10 reps - Standing Tandem Balance with Counter Support  - 2 x daily - 7 x weekly - 2-3 reps - 30 sec hold  ASSESSMENT:  CLINICAL IMPRESSION: ***   Patient is a 72 y.o. M who was seen today for physical therapy evaluation and treatment for reports of impaired balance and gait and general feeling of unsteadiness/fear of fall. Physical findings are consistent with MD impression as pt subjective complaints, as pt demonstrates decrease in narrow BoS balance and functional mobility. His FOTO score shows he is operating below baseline PLOF. He would benefit from skilled PT services working on improving gait and balance in order to decrease fall risk with community activities.   OBJECTIVE IMPAIRMENTS: Abnormal gait, decreased activity tolerance, decreased balance, decreased endurance, decreased mobility, difficulty walking, and decreased strength.   ACTIVITY LIMITATIONS: carrying, lifting, standing, squatting, stairs, transfers, and locomotion level  PARTICIPATION LIMITATIONS: driving, shopping, community activity, occupation, and yard work  PERSONAL FACTORS: Fitness, Time since onset of injury/illness/exacerbation, and 1-2 comorbidities: DM, Osteoporosis   are also affecting patient's functional outcome.   GOALS: Goals reviewed with patient? No  SHORT TERM GOALS: Target date: 06/23/2023   Pt will be compliant and knowledgeable with initial HEP for improved comfort and carryover Baseline: initial HEP given  Goal status: INITIAL  2.  Pt will be able to hold tandem stance for at least 25 seconds for improved balance with narrow BoS and  decreased fall risk Baseline: 5 seconds Goal status: INITIAL  LONG TERM GOALS: Target date: 07/28/2023   Pt will improve FOTO function score to no less than 62% as proxy for functional improvement Baseline: 55% function Goal status: INITIAL   2.  Pt will increase 30 Second Sit to Stand rep count to no less than 11 reps for improved balance, strength, and functional mobility Baseline: 9 reps  Goal status: INITIAL   3.  Pt will improve bilateral SLS time to no less than 25 seconds for improved balance and decreased fall risk Baseline: R - 3 seconds; L - 2 seconds  Goal status: INITIAL  4.  Pt will be able to ambulate up/down curb without need for  UE assist or LoB for improved safety and decreased fall risk Baseline: unable Goal status: INITIAL   PLAN:  PT FREQUENCY: 1x/week  PT DURATION: 8 weeks  PLANNED INTERVENTIONS: Therapeutic exercises, Therapeutic activity, Neuromuscular re-education, Balance training, Gait training, Patient/Family education, Self Care, Joint mobilization, Manual therapy, and Re-evaluation  PLAN FOR NEXT SESSION: assess HEP response, narrow BoS balance, obstacle challenges, curb navigation   Eloy End, PT 06/28/2023, 11:24 AM

## 2023-06-29 ENCOUNTER — Ambulatory Visit: Payer: Medicare Other

## 2023-06-29 DIAGNOSIS — R2681 Unsteadiness on feet: Secondary | ICD-10-CM | POA: Diagnosis not present

## 2023-06-29 DIAGNOSIS — R2689 Other abnormalities of gait and mobility: Secondary | ICD-10-CM

## 2023-07-06 ENCOUNTER — Ambulatory Visit: Payer: Medicare Other

## 2023-07-06 DIAGNOSIS — R2689 Other abnormalities of gait and mobility: Secondary | ICD-10-CM | POA: Diagnosis not present

## 2023-07-06 DIAGNOSIS — R2681 Unsteadiness on feet: Secondary | ICD-10-CM

## 2023-07-06 NOTE — Therapy (Signed)
OUTPATIENT PHYSICAL THERAPY TREATMENT NOTE   Patient Name: Geoffrey Castro MRN: [id number] DOB:[birth year], 72 y.o., male Today's Date: 07/06/2023  END OF SESSION:  PT End of Session - 07/06/23 1529     Visit Number 5    Number of Visits 9    Date for PT Re-Evaluation 07/28/23    Authorization Type [employer]    PT Start Time 1530    PT Stop Time 1609    PT Time Calculation (min) 39 min    Activity Tolerance Patient tolerated treatment well    Behavior During Therapy WFL for tasks assessed/performed                 Past Medical History:  Diagnosis Date   Diabetes mellitus without complication (HCC)    Gout    History of kidney stones    Hypertension    Osteoporosis    Reflux    Past Surgical History:  Procedure Laterality Date   CYSTOSCOPY W/ URETERAL STENT PLACEMENT Left 10/21/2016   Procedure: CYSTOSCOPY WITH LEFT RETROGRADE PYELOGRAM,LEFT URETEROSCOPY;  Surgeon: Heloise Purpura, MD;  Location: [facility];  Service: Urology;  Laterality: Left;   LITHOTRIPSY     Patient Active Problem List   Diagnosis Date Noted   Porokeratosis 02/08/2022    PCP: Renaye Rakers, MD  REFERRING PROVIDER: Renaye Rakers, MD  REFERRING DIAG: R26.89 (ICD-10-CM) - Other abnormalities of gait and mobility   THERAPY DIAG:  Unsteadiness on feet  Other abnormalities of gait and mobility  Rationale for Evaluation and Treatment: Rehabilitation  ONSET DATE: Chronic  SUBJECTIVE:   SUBJECTIVE STATEMENT: Pt presents to PT with no change in status. Has been compliant with HEP with no adverse effect.   PERTINENT HISTORY: DM, Osteoporosis   PAIN:  Are you having pain?  No: NPRS scale: 0/10  PRECAUTIONS: Fall  WEIGHT BEARING RESTRICTIONS: No  FALLS:  Has patient fallen in last 6 months? 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[employer]   PLOF: Independent  PATIENT GOALS: improve his walking and balance  OBJECTIVE:   DIAGNOSTIC FINDINGS:   N/A  PATIENT SURVEYS:  FOTO: 55% function; 62% predicted   COGNITION: Overall cognitive status: Within functional limits for tasks assessed     SENSATION: WFL  POSTURE: rounded shoulders, forward head, and flexed trunk   PALPATION: No TTP noted  LOWER EXTREMITY MMT:  MMT Right eval Left eval  Hip flexion 4/5 5/5  Hip extension    Hip abduction 4/5 5/5  Hip adduction 5/5 5/5  Hip internal rotation    Hip external rotation    Knee flexion    Knee extension    Ankle dorsiflexion    Ankle plantarflexion    Ankle inversion    Ankle eversion     (Blank rows = not tested)  LOWER EXTREMITY SPECIAL TESTS:  DNT  FUNCTIONAL TESTS:  30 Second Sit to Stand: 9 reps TUG: 12 seconds with no AD Tandem stance: 5 seconds SLS: R - 3 seconds; L - 2 seconds   GAIT: Distance walked: 61ft Assistive device utilized: None Level of assistance: Complete Independence Comments: wide BoS; decreased gait speed  TREATMENT: [facility] Adult PT Treatment:  DATE: 07/06/23 Therapeutic Exercise: Nustep L4 x 4 min while taking subjective  Step up x 10 each 8in B UE Standing hip abd/ext x 10 each STS 2x10 - no UE support LAQ 2x10 3# Supine SLR 2x10 each Bridge with ball 2x10 Supine clamshell 2x15 blue band Tandem stance on foam 2x30" each Hurdle step over 4 x laps in //  [facility] Adult PT Treatment:                                                DATE: 06/29/23 Therapeutic Exercise: Nustep L4 x 4 min while taking subjective  Step up x 10 each 8in B UE STS 2x10 - no UE support LAQ 2x10 2.5# Supine SLR x 15 each Bridge 2x10 Supine clamshell 2x15 GTB Neuromuscular re-ed: Tandem walk x 2 laps in // Hurdle step over 4 x laps in // FT EC on foam 2x30"  [facility] Adult PT Treatment:                                                 DATE: 06/22/23 Therapeutic Exercise: Nustep L4 x 4 min while taking subjective  Step up x 10 each 8in B UE LAQ 2x10 2# Supine SLR 2x10 each Bridge 2x10 Supine clamshell 2x15 GTB Neuromuscular re-ed: Tandem x 30" each Tandem walk x 2 laps in // Walking march in // x 2 laps FT EC on foam 2x30"  [facility] Adult PT Treatment:                                                DATE: 06/15/23 Therapeutic Exercise: Nustep L2 5 min Slant board gastroc stretch 30s x2 Supine bridge 2x10 SLR 2x10 B Supine hip fallouts GTB 10x B 10/10 unilaterally STS 5x w/o UE support Standing PF 10x  PATIENT EDUCATION:  Education details: eval findings, FOTO, HEP, POC Person educated: Patient Education method: Explanation, Demonstration, and Handouts Education comprehension: verbalized understanding and returned demonstration  HOME EXERCISE PROGRAM: Access Code: [email or web address] URL: [email or web address] Date: 06/02/2023 Prepared by: Edwinna Areola  Exercises - Sit to Stand  - 2 x daily - 7 x weekly - 2 sets - 10 reps - Standing March with Counter Support  - 2 x daily - 7 x weekly - 2 sets - 10 reps - Standing Tandem Balance with Counter Support  - 2 x daily - 7 x weekly - 2-3 reps - 30 sec hold  ASSESSMENT:  CLINICAL IMPRESSION: Pt was able to complete all prescribed exercises with no adverse effect. Therapy focused on LE strengthening and improving balance. Pt continues to benefit from skilled PT, will continue per POC.   Patient is a 72 y.o. M who was seen today for physical therapy evaluation and treatment for reports of impaired balance and gait and general feeling of unsteadiness/fear of fall. Physical findings are consistent with MD impression as pt subjective complaints, as pt demonstrates decrease in narrow BoS balance and functional mobility. His FOTO score shows he is operating below baseline PLOF. He would benefit from skilled PT services working on improving gait  and balance in  order to decrease fall risk with community activities.   OBJECTIVE IMPAIRMENTS: Abnormal gait, decreased activity tolerance, decreased balance, decreased endurance, decreased mobility, difficulty walking, and decreased strength.   ACTIVITY LIMITATIONS: carrying, lifting, standing, squatting, stairs, transfers, and locomotion level  PARTICIPATION LIMITATIONS: driving, shopping, community activity, occupation, and yard work  PERSONAL FACTORS: Fitness, Time since onset of injury/illness/exacerbation, and 1-2 comorbidities: DM, Osteoporosis   are also affecting patient's functional outcome.   GOALS: Goals reviewed with patient? No  SHORT TERM GOALS: Target date: 06/23/2023   Pt will be compliant and knowledgeable with initial HEP for improved comfort and carryover Baseline: initial HEP given  Goal status: INITIAL  2.  Pt will be able to hold tandem stance for at least 25 seconds for improved balance with narrow BoS and decreased fall risk Baseline: 5 seconds Goal status: INITIAL  LONG TERM GOALS: Target date: 07/28/2023   Pt will improve FOTO function score to no less than 62% as proxy for functional improvement Baseline: 55% function Goal status: INITIAL   2.  Pt will increase 30 Second Sit to Stand rep count to no less than 11 reps for improved balance, strength, and functional mobility Baseline: 9 reps  Goal status: INITIAL   3.  Pt will improve bilateral SLS time to no less than 25 seconds for improved balance and decreased fall risk Baseline: R - 3 seconds; L - 2 seconds  Goal status: INITIAL  4.  Pt will be able to ambulate up/down curb without need for UE assist or LoB for improved safety and decreased fall risk Baseline: unable Goal status: INITIAL   PLAN:  PT FREQUENCY: 1x/week  PT DURATION: 8 weeks  PLANNED INTERVENTIONS: Therapeutic exercises, Therapeutic activity, Neuromuscular re-education, Balance training, Gait training, Patient/Family education,  Self Care, Joint mobilization, Manual therapy, and Re-evaluation  PLAN FOR NEXT SESSION: assess HEP response, narrow BoS balance, obstacle challenges, curb navigation   Eloy End, PT 07/06/2023, 4:11 PM

## 2023-07-13 ENCOUNTER — Ambulatory Visit: Payer: Medicare Other

## 2023-07-13 DIAGNOSIS — R2689 Other abnormalities of gait and mobility: Secondary | ICD-10-CM

## 2023-07-13 DIAGNOSIS — R2681 Unsteadiness on feet: Secondary | ICD-10-CM | POA: Diagnosis not present

## 2023-07-13 NOTE — Therapy (Signed)
OUTPATIENT PHYSICAL THERAPY TREATMENT NOTE   Patient Name: Geoffrey Castro MRN: [id number] DOB:August 10, 1951, 72 y.o., male Today's Date: 07/13/2023  END OF SESSION:  PT End of Session - 07/13/23 1354     Visit Number 6    Number of Visits 9    Date for PT Re-Evaluation 07/28/23    Authorization Type [employer]    PT Start Time 1400    PT Stop Time 1438    PT Time Calculation (min) 38 min    Activity Tolerance Patient tolerated treatment well    Behavior During Therapy WFL for tasks assessed/performed                  Past Medical History:  Diagnosis Date   Diabetes mellitus without complication (HCC)    Gout    History of kidney stones    Hypertension    Osteoporosis    Reflux    Past Surgical History:  Procedure Laterality Date   CYSTOSCOPY W/ URETERAL STENT PLACEMENT Left 10/21/2016   Procedure: CYSTOSCOPY WITH LEFT RETROGRADE PYELOGRAM,LEFT URETEROSCOPY;  Surgeon: Heloise Purpura, MD;  Location: [facility];  Service: Urology;  Laterality: Left;   LITHOTRIPSY     Patient Active Problem List   Diagnosis Date Noted   Porokeratosis 02/08/2022    PCP: Renaye Rakers, MD  REFERRING PROVIDER: Renaye Rakers, MD  REFERRING DIAG: R26.89 (ICD-10-CM) - Other abnormalities of gait and mobility   THERAPY DIAG:  Unsteadiness on feet  Other abnormalities of gait and mobility  Rationale for Evaluation and Treatment: Rehabilitation  ONSET DATE: Chronic  SUBJECTIVE:   SUBJECTIVE STATEMENT: Pt presents to PT with reports of R ankle pain after yard work. Had muscle soreness after last session. Has been compliant with HEP.   PERTINENT HISTORY: DM, Osteoporosis   PAIN:  Are you having pain?  No: NPRS scale: 0/10  PRECAUTIONS: Fall  WEIGHT BEARING RESTRICTIONS: No  FALLS:  Has patient fallen in last 6 months? 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[employer]   PLOF: Independent  PATIENT GOALS: improve his walking and balance  OBJECTIVE:   DIAGNOSTIC FINDINGS:   N/A  PATIENT SURVEYS:  FOTO: 55% function; 62% predicted   COGNITION: Overall cognitive status: Within functional limits for tasks assessed     SENSATION: WFL  POSTURE: rounded shoulders, forward head, and flexed trunk   PALPATION: No TTP noted  LOWER EXTREMITY MMT:  MMT Right eval Left eval  Hip flexion 4/5 5/5  Hip extension    Hip abduction 4/5 5/5  Hip adduction 5/5 5/5  Hip internal rotation    Hip external rotation    Knee flexion    Knee extension    Ankle dorsiflexion    Ankle plantarflexion    Ankle inversion    Ankle eversion     (Blank rows = not tested)  LOWER EXTREMITY SPECIAL TESTS:  DNT  FUNCTIONAL TESTS:  30 Second Sit to Stand: 9 reps TUG: 12 seconds with no AD Tandem stance: 5 seconds SLS: R - 3 seconds; L - 2 seconds   GAIT: Distance walked: 55ft Assistive device utilized: None Level of assistance: Complete Independence Comments: wide BoS; decreased gait speed  TREATMENT: [facility] Adult PT Treatment:  DATE: 07/13/23 Therapeutic Exercise: Nustep L5 x 4 min while taking subjective  Lateral walk RTB x 2 laps at counter Standing hip abd/ext x 10 each RTB Step up x 10 each 8in B UE STS 2x10 - no UE support LAQ 2x10 3# Seated hamstring curl 2x10 black band Neuromuscular Re-Ed Tandem stance on foam 2x30" each SLS x 30" each Hurdle step over  fwd (5) x 3 laps in // Lateral hurdle step over x 5  Tandem walk x 2 laps in // Walking march x 2 laps in //  [facility] Adult PT Treatment:                                                DATE: 07/06/23 Therapeutic Exercise: Nustep L4 x 4 min while taking subjective  Step up x 10 each 8in B UE Standing hip abd/ext x 10 each STS 2x10 - no UE support LAQ 2x10 3# Supine SLR  2x10 each Bridge with ball 2x10 Supine clamshell 2x15 blue band Tandem stance on foam 2x30" each Hurdle step over 4 x laps in //  [facility] Adult PT Treatment:                                                DATE: 06/29/23 Therapeutic Exercise: Nustep L4 x 4 min while taking subjective  Step up x 10 each 8in B UE STS 2x10 - no UE support LAQ 2x10 2.5# Supine SLR x 15 each Bridge 2x10 Supine clamshell 2x15 GTB Neuromuscular re-ed: Tandem walk x 2 laps in // Hurdle step over 4 x laps in // FT EC on foam 2x30"  [facility] Adult PT Treatment:                                                DATE: 06/22/23 Therapeutic Exercise: Nustep L4 x 4 min while taking subjective  Step up x 10 each 8in B UE LAQ 2x10 2# Supine SLR 2x10 each Bridge 2x10 Supine clamshell 2x15 GTB Neuromuscular re-ed: Tandem x 30" each Tandem walk x 2 laps in // Walking march in // x 2 laps FT EC on foam 2x30"  [facility] Adult PT Treatment:                                                DATE: 06/15/23 Therapeutic Exercise: Nustep L2 5 min Slant board gastroc stretch 30s x2 Supine bridge 2x10 SLR 2x10 B Supine hip fallouts GTB 10x B 10/10 unilaterally STS 5x w/o UE support Standing PF 10x  PATIENT EDUCATION:  Education details: eval findings, FOTO, HEP, POC Person educated: Patient Education method: Explanation, Demonstration, and Handouts Education comprehension: verbalized understanding and returned demonstration  HOME EXERCISE PROGRAM: Access Code: [email or web address] URL: [email or web address] Date: 06/02/2023 Prepared by: Edwinna Areola  Exercises - Sit to Stand  - 2 x daily - 7 x weekly - 2 sets - 10 reps - Standing March with Counter Support  - 2 x daily -  7 x weekly - 2 sets - 10 reps - Standing Tandem Balance with Counter Support  - 2 x daily - 7 x weekly - 2-3 reps - 30 sec hold  ASSESSMENT:  CLINICAL IMPRESSION:  Pt was able to complete all prescribed exercises with no adverse effect. Therapy focused  on LE strengthening and improving balance in static and dynamic positions. Pt continues to benefit from skilled PT, will continue per POC and assess goals at next session.   OBJECTIVE IMPAIRMENTS: Abnormal gait, decreased activity tolerance, decreased balance, decreased endurance, decreased mobility, difficulty walking, and decreased strength.   ACTIVITY LIMITATIONS: carrying, lifting, standing, squatting, stairs, transfers, and locomotion level  PARTICIPATION LIMITATIONS: driving, shopping, community activity, occupation, and yard work  PERSONAL FACTORS: Fitness, Time since onset of injury/illness/exacerbation, and 1-2 comorbidities: DM, Osteoporosis   are also affecting patient's functional outcome.   GOALS: Goals reviewed with patient? No  SHORT TERM GOALS: Target date: 06/23/2023   Pt will be compliant and knowledgeable with initial HEP for improved comfort and carryover Baseline: initial HEP given  Goal status: INITIAL  2.  Pt will be able to hold tandem stance for at least 25 seconds for improved balance with narrow BoS and decreased fall risk Baseline: 5 seconds Goal status: INITIAL  LONG TERM GOALS: Target date: 07/28/2023   Pt will improve FOTO function score to no less than 62% as proxy for functional improvement Baseline: 55% function Goal status: INITIAL   2.  Pt will increase 30 Second Sit to Stand rep count to no less than 11 reps for improved balance, strength, and functional mobility Baseline: 9 reps  Goal status: INITIAL   3.  Pt will improve bilateral SLS time to no less than 25 seconds for improved balance and decreased fall risk Baseline: R - 3 seconds; L - 2 seconds  Goal status: INITIAL  4.  Pt will be able to ambulate up/down curb without need for UE assist or LoB for improved safety and decreased fall risk Baseline: unable Goal status: INITIAL   PLAN:  PT FREQUENCY: 1x/week  PT DURATION: 8 weeks  PLANNED INTERVENTIONS: Therapeutic exercises,  Therapeutic activity, Neuromuscular re-education, Balance training, Gait training, Patient/Family education, Self Care, Joint mobilization, Manual therapy, and Re-evaluation  PLAN FOR NEXT SESSION: assess HEP response, narrow BoS balance, obstacle challenges, curb navigation   Eloy End, PT 07/13/2023, 2:43 PM

## 2023-07-19 NOTE — Therapy (Signed)
OUTPATIENT PHYSICAL THERAPY TREATMENT NOTE/DISCHARGE  PHYSICAL THERAPY DISCHARGE SUMMARY  Visits from Start of Care: 7  Current functional level related to goals / functional outcomes: See goals and objective   Remaining deficits: See goals and objective   Education / Equipment: HEP   Patient agrees to discharge. Patient goals were met. Patient is being discharged due to meeting the stated rehab goals.   Patient Name: Geoffrey Castro MRN: [id number] DOB:01/18/1951, 72 y.o., male Today's Date: 07/20/2023  END OF SESSION:  PT End of Session - 07/20/23 1350     Visit Number 7    Number of Visits 9    Date for PT Re-Evaluation 07/28/23    Authorization Type [employer]    PT Start Time 1400    PT Stop Time 1425    PT Time Calculation (min) 25 min    Activity Tolerance Patient tolerated treatment well    Behavior During Therapy WFL for tasks assessed/performed                   Past Medical History:  Diagnosis Date   Diabetes mellitus without complication (HCC)    Gout    History of kidney stones    Hypertension    Osteoporosis    Reflux    Past Surgical History:  Procedure Laterality Date   CYSTOSCOPY W/ URETERAL STENT PLACEMENT Left 10/21/2016   Procedure: CYSTOSCOPY WITH LEFT RETROGRADE PYELOGRAM,LEFT URETEROSCOPY;  Surgeon: Heloise Purpura, MD;  Location: [facility];  Service: Urology;  Laterality: Left;   LITHOTRIPSY     Patient Active Problem List   Diagnosis Date Noted   Porokeratosis 02/08/2022    PCP: Renaye Rakers, MD  REFERRING PROVIDER: Renaye Rakers, MD  REFERRING DIAG: R26.89 (ICD-10-CM) - Other abnormalities of gait and mobility   THERAPY DIAG:  Unsteadiness on feet  Other abnormalities of gait and mobility  Rationale for Evaluation and Treatment: Rehabilitation  ONSET DATE: Chronic  SUBJECTIVE:   SUBJECTIVE STATEMENT: Pt presents to PT with no current reports of pain. Has been compliant with HEP with no adverse effect.   PERTINENT  HISTORY: DM, Osteoporosis   PAIN:  Are you having pain?  No: NPRS scale: 0/10  PRECAUTIONS: Fall  WEIGHT BEARING RESTRICTIONS: No  FALLS:  Has patient fallen in last 6 months? 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[employer]   PLOF: Independent  PATIENT GOALS: improve his walking and balance  OBJECTIVE:   DIAGNOSTIC FINDINGS:   N/A  PATIENT SURVEYS:  FOTO: 55% function; 62% predicted 07/20/2023: 60% function   COGNITION: Overall cognitive status: Within functional limits for tasks assessed     SENSATION: WFL  POSTURE: rounded shoulders, forward head, and flexed trunk   PALPATION: No TTP noted  LOWER EXTREMITY MMT:  MMT Right eval Left eval  Hip flexion 4/5 5/5  Hip extension    Hip abduction 4/5 5/5  Hip adduction 5/5 5/5  Hip internal rotation    Hip external rotation    Knee flexion    Knee extension    Ankle dorsiflexion    Ankle plantarflexion    Ankle inversion    Ankle eversion     (Blank rows = not tested)  LOWER EXTREMITY SPECIAL TESTS:  DNT  FUNCTIONAL TESTS:  30 Second Sit to Stand: 12 reps Tandem stance: 25 seconds each  SLS: R - 10 seconds; L - 8 seconds   GAIT: Distance walked: 39ft Assistive device utilized: None Level of assistance: Complete Independence Comments: wide BoS; decreased gait speed  TREATMENT: [facility] Adult PT Treatment:                                                DATE: 07/20/23 Therapeutic Exercise: Nustep L5 x 3 min while taking subjective  STS x 10 - no UE Tandem stance x 30" each SLS x 30" each Standing hip abd/ext x 10 each Supine SLR x 15 each Supine clamshell x 15 blue band Bridge with blue band 2x10 Therapeutic Activity: Assessment of tests/measures, goals, and outcomes for discharge  [facility] Adult PT Treatment:                                                 DATE: 07/13/23 Therapeutic Exercise: Nustep L5 x 4 min while taking subjective  Lateral walk RTB x 2 laps at counter Standing hip abd/ext x 10 each RTB Step up x 10 each 8in B UE STS 2x10 - no UE support LAQ 2x10 3# Seated hamstring curl 2x10 black band Neuromuscular Re-Ed Tandem stance on foam 2x30" each SLS x 30" each Hurdle step over  fwd (5) x 3 laps in // Lateral hurdle step over x 5  Tandem walk x 2 laps in // Walking march x 2 laps in //  [facility] Adult PT Treatment:                                                DATE: 07/06/23 Therapeutic Exercise: Nustep L4 x 4 min while taking subjective  Step up x 10 each 8in B UE Standing hip abd/ext x 10 each STS 2x10 - no UE support LAQ 2x10 3# Supine SLR 2x10 each Bridge with ball 2x10 Supine clamshell 2x15 blue band Tandem stance on foam 2x30" each Hurdle step over 4 x laps in //  [facility] Adult PT Treatment:                                                DATE: 06/29/23 Therapeutic Exercise: Nustep L4 x 4 min while taking subjective  Step up x 10 each 8in B UE STS 2x10 - no UE support LAQ 2x10 2.5# Supine SLR x 15 each Bridge 2x10 Supine clamshell 2x15 GTB Neuromuscular re-ed: Tandem walk x 2 laps in // Hurdle step over 4 x laps in // FT EC on foam 2x30"  PATIENT EDUCATION:  Education details: eval findings, FOTO, HEP, POC Person educated: Patient Education method: Explanation, Demonstration, and Handouts Education comprehension: verbalized understanding and returned demonstration  HOME EXERCISE PROGRAM: Access Code: [email or web address] URL: [email or web address] Date: 06/02/2023 Prepared by: Edwinna Areola  Exercises - Sit to Stand  - 2 x daily - 7 x weekly - 2 sets - 10 reps - Standing March with Counter Support  - 2 x daily - 7 x weekly -  2 sets - 10 reps - Standing Tandem Balance with Counter Support  - 2 x daily - 7 x weekly - 2-3 reps - 30 sec  hold  ASSESSMENT:  CLINICAL IMPRESSION:  Pt was able to complete all prescribed exercises and demonstrated knowledge of HEP with no adverse effect. Over the course of PT treatment he has progressed very well, showing improvement in balance and functional mobility. He is pleased with his functional improvement and feels ready for discharge at this time. Should continue to improve with HEP compliance.   OBJECTIVE IMPAIRMENTS: Abnormal gait, decreased activity tolerance, decreased balance, decreased endurance, decreased mobility, difficulty walking, and decreased strength.   ACTIVITY LIMITATIONS: carrying, lifting, standing, squatting, stairs, transfers, and locomotion level  PARTICIPATION LIMITATIONS: driving, shopping, community activity, occupation, and yard work  PERSONAL FACTORS: Fitness, Time since onset of injury/illness/exacerbation, and 1-2 comorbidities: DM, Osteoporosis   are also affecting patient's functional outcome.   GOALS: Goals reviewed with patient? No  SHORT TERM GOALS: Target date: 06/23/2023   Pt will be compliant and knowledgeable with initial HEP for improved comfort and carryover Baseline: initial HEP given  Goal status: MET  2.  Pt will be able to hold tandem stance for at least 25 seconds for improved balance with narrow BoS and decreased fall risk Baseline: 5 seconds Goal status: MET  LONG TERM GOALS: Target date: 07/28/2023   Pt will improve FOTO function score to no less than 62% as proxy for functional improvement Baseline: 55% function 07/20/2023: 60% function Goal status: MOSTLY MET   2.  Pt will increase 30 Second Sit to Stand rep count to no less than 11 reps for improved balance, strength, and functional mobility Baseline: 9 reps  07/20/2023: 12 reps Goal status: MET   3.  Pt will improve bilateral SLS time to no less than 25 seconds for improved balance and decreased fall risk Baseline: R - 3 seconds; L - 2 seconds  07/20/2023:  R - 10 seconds; L -  8 seconds  Goal status: NOT MET  4.  Pt will be able to ambulate up/down curb without need for UE assist or LoB for improved safety and decreased fall risk Baseline: unable 07/20/2023: able Goal status: MET   PLAN:  PT FREQUENCY: 1x/week  PT DURATION: 8 weeks  PLANNED INTERVENTIONS: Therapeutic exercises, Therapeutic activity, Neuromuscular re-education, Balance training, Gait training, Patient/Family education, Self Care, Joint mobilization, Manual therapy, and Re-evaluation  PLAN FOR NEXT SESSION: assess HEP response, narrow BoS balance, obstacle challenges, curb navigation   Eloy End, PT 07/20/2023, 2:31 PM

## 2023-07-20 ENCOUNTER — Ambulatory Visit: Payer: Medicare Other

## 2023-07-20 DIAGNOSIS — R2689 Other abnormalities of gait and mobility: Secondary | ICD-10-CM | POA: Diagnosis not present

## 2023-07-20 DIAGNOSIS — R2681 Unsteadiness on feet: Secondary | ICD-10-CM

## 2023-08-23 DIAGNOSIS — Z1211 Encounter for screening for malignant neoplasm of colon: Secondary | ICD-10-CM | POA: Diagnosis not present

## 2023-08-23 DIAGNOSIS — K5730 Diverticulosis of large intestine without perforation or abscess without bleeding: Secondary | ICD-10-CM | POA: Diagnosis not present

## 2023-08-23 DIAGNOSIS — I10 Essential (primary) hypertension: Secondary | ICD-10-CM | POA: Diagnosis not present

## 2023-08-23 DIAGNOSIS — E119 Type 2 diabetes mellitus without complications: Secondary | ICD-10-CM | POA: Diagnosis not present

## 2023-08-23 DIAGNOSIS — K5904 Chronic idiopathic constipation: Secondary | ICD-10-CM | POA: Diagnosis not present

## 2023-08-23 DIAGNOSIS — K219 Gastro-esophageal reflux disease without esophagitis: Secondary | ICD-10-CM | POA: Diagnosis not present

## 2023-08-23 DIAGNOSIS — Z86010 Personal history of colonic polyps: Secondary | ICD-10-CM | POA: Diagnosis not present

## 2023-08-31 ENCOUNTER — Encounter: Payer: Self-pay | Admitting: Podiatry

## 2023-08-31 ENCOUNTER — Ambulatory Visit: Payer: Medicare Other | Admitting: Podiatry

## 2023-08-31 DIAGNOSIS — Q828 Other specified congenital malformations of skin: Secondary | ICD-10-CM

## 2023-08-31 DIAGNOSIS — B351 Tinea unguium: Secondary | ICD-10-CM | POA: Diagnosis not present

## 2023-08-31 DIAGNOSIS — E119 Type 2 diabetes mellitus without complications: Secondary | ICD-10-CM

## 2023-08-31 DIAGNOSIS — M79676 Pain in unspecified toe(s): Secondary | ICD-10-CM | POA: Diagnosis not present

## 2023-08-31 NOTE — Progress Notes (Signed)
This patient returns to the office for evaluation and treatment of long thick painful nails .  This patient is unable to trim his own nails since the patient cannot reach the feet.  Patient says the nails are painful walking and wearing his shoes. He also says he has a painful callus under the big toe joint right foot.  This callus is painful walking and wearing her shoes.   He returns for preventive foot care services.  Patient is diabetic on Januvia.  General Appearance  Alert, conversant and in no acute stress.  Vascular  Dorsalis pedis and posterior tibial  pulses are palpable  bilaterally.  Capillary return is within normal limits  bilaterally. Temperature is within normal limits  bilaterally.  Neurologic  Senn-Weinstein monofilament wire test within normal limits  bilaterally. Muscle power within normal limits bilaterally.  Nails Thick disfigured discolored nails with subungual debris  from hallux to fifth toes bilaterally. No evidence of bacterial infection or drainage bilaterally.  Orthopedic  No limitations of motion  feet .  No crepitus or effusions noted.  No bony pathology or digital deformities noted.  Skin  normotropic skin  noted bilaterally.  No signs of infections or ulcers noted.   Porokeratosis sub 1 right foot.  Onychomycosis  Pain in toes right foot  Pain in toes left foot.  Porokeratosis right foot  Debridement  of nails  1-5  B/L with a nail nipper.  Nails were then filed using a dremel tool with no incidents. . Debride callus as a courtesy.   RTC  3 months    Helane Gunther DPM

## 2023-09-19 DIAGNOSIS — R350 Frequency of micturition: Secondary | ICD-10-CM | POA: Diagnosis not present

## 2023-10-05 DIAGNOSIS — Z1211 Encounter for screening for malignant neoplasm of colon: Secondary | ICD-10-CM | POA: Diagnosis not present

## 2023-10-05 DIAGNOSIS — Z860100 Personal history of colon polyps, unspecified: Secondary | ICD-10-CM | POA: Diagnosis not present

## 2023-10-05 DIAGNOSIS — K5730 Diverticulosis of large intestine without perforation or abscess without bleeding: Secondary | ICD-10-CM | POA: Diagnosis not present

## 2023-10-05 DIAGNOSIS — Z860101 Personal history of adenomatous and serrated colon polyps: Secondary | ICD-10-CM | POA: Diagnosis not present

## 2023-10-05 DIAGNOSIS — K635 Polyp of colon: Secondary | ICD-10-CM | POA: Diagnosis not present

## 2023-10-05 DIAGNOSIS — D122 Benign neoplasm of ascending colon: Secondary | ICD-10-CM | POA: Diagnosis not present

## 2023-10-05 DIAGNOSIS — K648 Other hemorrhoids: Secondary | ICD-10-CM | POA: Diagnosis not present

## 2023-10-05 DIAGNOSIS — Z860102 Personal history of hyperplastic colon polyps: Secondary | ICD-10-CM | POA: Diagnosis not present

## 2023-11-21 DIAGNOSIS — M130 Polyarthritis, unspecified: Secondary | ICD-10-CM | POA: Diagnosis not present

## 2023-11-21 DIAGNOSIS — E1169 Type 2 diabetes mellitus with other specified complication: Secondary | ICD-10-CM | POA: Diagnosis not present

## 2023-11-21 DIAGNOSIS — E785 Hyperlipidemia, unspecified: Secondary | ICD-10-CM | POA: Diagnosis not present

## 2023-11-21 DIAGNOSIS — I10 Essential (primary) hypertension: Secondary | ICD-10-CM | POA: Diagnosis not present

## 2023-11-22 DIAGNOSIS — M109 Gout, unspecified: Secondary | ICD-10-CM | POA: Diagnosis not present

## 2023-11-22 DIAGNOSIS — L03818 Cellulitis of other sites: Secondary | ICD-10-CM | POA: Diagnosis not present

## 2023-11-22 DIAGNOSIS — E1169 Type 2 diabetes mellitus with other specified complication: Secondary | ICD-10-CM | POA: Diagnosis not present

## 2023-11-22 DIAGNOSIS — M8580 Other specified disorders of bone density and structure, unspecified site: Secondary | ICD-10-CM | POA: Diagnosis not present

## 2023-11-22 DIAGNOSIS — I10 Essential (primary) hypertension: Secondary | ICD-10-CM | POA: Diagnosis not present

## 2023-11-23 DIAGNOSIS — M7631 Iliotibial band syndrome, right leg: Secondary | ICD-10-CM | POA: Diagnosis not present

## 2023-11-30 ENCOUNTER — Ambulatory Visit: Payer: Medicare Other | Admitting: Podiatry

## 2023-12-02 ENCOUNTER — Encounter: Payer: Self-pay | Admitting: Podiatry

## 2023-12-02 ENCOUNTER — Ambulatory Visit: Payer: Medicare Other | Admitting: Podiatry

## 2023-12-02 DIAGNOSIS — F17200 Nicotine dependence, unspecified, uncomplicated: Secondary | ICD-10-CM | POA: Insufficient documentation

## 2023-12-02 DIAGNOSIS — I10 Essential (primary) hypertension: Secondary | ICD-10-CM | POA: Insufficient documentation

## 2023-12-02 DIAGNOSIS — M79676 Pain in unspecified toe(s): Secondary | ICD-10-CM

## 2023-12-02 DIAGNOSIS — Q828 Other specified congenital malformations of skin: Secondary | ICD-10-CM

## 2023-12-02 DIAGNOSIS — L84 Corns and callosities: Secondary | ICD-10-CM | POA: Insufficient documentation

## 2023-12-02 DIAGNOSIS — E119 Type 2 diabetes mellitus without complications: Secondary | ICD-10-CM | POA: Insufficient documentation

## 2023-12-02 DIAGNOSIS — M549 Dorsalgia, unspecified: Secondary | ICD-10-CM | POA: Insufficient documentation

## 2023-12-02 DIAGNOSIS — K5900 Constipation, unspecified: Secondary | ICD-10-CM | POA: Insufficient documentation

## 2023-12-02 DIAGNOSIS — Z860100 Personal history of colon polyps, unspecified: Secondary | ICD-10-CM | POA: Insufficient documentation

## 2023-12-02 DIAGNOSIS — K5730 Diverticulosis of large intestine without perforation or abscess without bleeding: Secondary | ICD-10-CM | POA: Insufficient documentation

## 2023-12-02 DIAGNOSIS — K648 Other hemorrhoids: Secondary | ICD-10-CM | POA: Insufficient documentation

## 2023-12-02 DIAGNOSIS — M79641 Pain in right hand: Secondary | ICD-10-CM | POA: Insufficient documentation

## 2023-12-02 DIAGNOSIS — N200 Calculus of kidney: Secondary | ICD-10-CM | POA: Insufficient documentation

## 2023-12-02 DIAGNOSIS — K625 Hemorrhage of anus and rectum: Secondary | ICD-10-CM | POA: Insufficient documentation

## 2023-12-02 DIAGNOSIS — M25561 Pain in right knee: Secondary | ICD-10-CM | POA: Insufficient documentation

## 2023-12-02 DIAGNOSIS — K219 Gastro-esophageal reflux disease without esophagitis: Secondary | ICD-10-CM | POA: Insufficient documentation

## 2023-12-02 DIAGNOSIS — G5760 Lesion of plantar nerve, unspecified lower limb: Secondary | ICD-10-CM | POA: Insufficient documentation

## 2023-12-02 DIAGNOSIS — F411 Generalized anxiety disorder: Secondary | ICD-10-CM | POA: Insufficient documentation

## 2023-12-02 DIAGNOSIS — Z1211 Encounter for screening for malignant neoplasm of colon: Secondary | ICD-10-CM | POA: Insufficient documentation

## 2023-12-02 DIAGNOSIS — M7750 Other enthesopathy of unspecified foot: Secondary | ICD-10-CM | POA: Insufficient documentation

## 2023-12-02 DIAGNOSIS — B351 Tinea unguium: Secondary | ICD-10-CM | POA: Diagnosis not present

## 2023-12-02 DIAGNOSIS — M25579 Pain in unspecified ankle and joints of unspecified foot: Secondary | ICD-10-CM | POA: Insufficient documentation

## 2023-12-02 DIAGNOSIS — M810 Age-related osteoporosis without current pathological fracture: Secondary | ICD-10-CM | POA: Insufficient documentation

## 2023-12-02 DIAGNOSIS — L600 Ingrowing nail: Secondary | ICD-10-CM | POA: Insufficient documentation

## 2023-12-02 DIAGNOSIS — M1990 Unspecified osteoarthritis, unspecified site: Secondary | ICD-10-CM | POA: Insufficient documentation

## 2023-12-02 DIAGNOSIS — K5904 Chronic idiopathic constipation: Secondary | ICD-10-CM | POA: Insufficient documentation

## 2023-12-02 NOTE — Progress Notes (Signed)
This patient returns to the office for evaluation and treatment of long thick painful nails .  This patient is unable to trim his own nails since the patient cannot reach the feet.  Patient says the nails are painful walking and wearing his shoes. He also says he has a painful callus under the big toe joint right foot.  This callus is painful walking and wearing her shoes.   He returns for preventive foot care services.  Patient is diabetic on Januvia.  General Appearance  Alert, conversant and in no acute stress.  Vascular  Dorsalis pedis and posterior tibial  pulses are palpable  bilaterally.  Capillary return is within normal limits  bilaterally. Temperature is within normal limits  bilaterally.  Neurologic  Senn-Weinstein monofilament wire test within normal limits  bilaterally. Muscle power within normal limits bilaterally.  Nails Thick disfigured discolored nails with subungual debris  from hallux to fifth toes bilaterally. No evidence of bacterial infection or drainage bilaterally.  Orthopedic  No limitations of motion  feet .  No crepitus or effusions noted.  No bony pathology or digital deformities noted.  Skin  normotropic skin  noted bilaterally.  No signs of infections or ulcers noted.   Porokeratosis sub 1 right foot.  Onychomycosis  Pain in toes right foot  Pain in toes left foot.  Porokeratosis right foot  Debridement  of nails  1-5  B/L with a nail nipper.  Nails were then filed using a dremel tool with no incidents. . Debride callus as a courtesy.   RTC  3 months    Helane Gunther DPM

## 2023-12-20 DIAGNOSIS — M7631 Iliotibial band syndrome, right leg: Secondary | ICD-10-CM | POA: Diagnosis not present

## 2024-01-23 DIAGNOSIS — M1040 Other secondary gout, unspecified site: Secondary | ICD-10-CM | POA: Diagnosis not present

## 2024-01-23 DIAGNOSIS — M109 Gout, unspecified: Secondary | ICD-10-CM | POA: Diagnosis not present

## 2024-01-23 DIAGNOSIS — E1169 Type 2 diabetes mellitus with other specified complication: Secondary | ICD-10-CM | POA: Diagnosis not present

## 2024-01-27 DIAGNOSIS — M25522 Pain in left elbow: Secondary | ICD-10-CM | POA: Diagnosis not present

## 2024-02-01 DIAGNOSIS — M25522 Pain in left elbow: Secondary | ICD-10-CM | POA: Diagnosis not present

## 2024-02-14 DIAGNOSIS — M130 Polyarthritis, unspecified: Secondary | ICD-10-CM | POA: Diagnosis not present

## 2024-02-14 DIAGNOSIS — M109 Gout, unspecified: Secondary | ICD-10-CM | POA: Diagnosis not present

## 2024-02-14 DIAGNOSIS — E1169 Type 2 diabetes mellitus with other specified complication: Secondary | ICD-10-CM | POA: Diagnosis not present

## 2024-02-15 ENCOUNTER — Ambulatory Visit: Payer: Medicare Other | Admitting: Podiatry

## 2024-02-15 ENCOUNTER — Encounter: Payer: Self-pay | Admitting: Podiatry

## 2024-02-15 DIAGNOSIS — B351 Tinea unguium: Secondary | ICD-10-CM | POA: Diagnosis not present

## 2024-02-15 DIAGNOSIS — M79676 Pain in unspecified toe(s): Secondary | ICD-10-CM

## 2024-02-15 DIAGNOSIS — E119 Type 2 diabetes mellitus without complications: Secondary | ICD-10-CM | POA: Diagnosis not present

## 2024-02-15 DIAGNOSIS — Q828 Other specified congenital malformations of skin: Secondary | ICD-10-CM

## 2024-02-15 NOTE — Progress Notes (Addendum)
This patient returns to the office for evaluation and treatment of long thick painful nails .  This patient is unable to trim his own nails since the patient cannot reach the feet.  Patient says the nails are painful walking and wearing his shoes. He also says he has a painful callus under the big toe joint right foot.  This callus is painful walking and wearing her shoes.   He returns for preventive foot care services.  Patient is diabetic on Januvia.  General Appearance  Alert, conversant and in no acute stress.  Vascular  Dorsalis pedis and posterior tibial  pulses are palpable  bilaterally.  Capillary return is within normal limits  bilaterally. Temperature is within normal limits  bilaterally.  Neurologic  Senn-Weinstein monofilament wire test within normal limits  bilaterally. Muscle power within normal limits bilaterally.  Nails Thick disfigured discolored nails with subungual debris  from hallux to fifth toes bilaterally. No evidence of bacterial infection or drainage bilaterally.  Orthopedic  No limitations of motion  feet .  No crepitus or effusions noted.  No bony pathology or digital deformities noted.  Skin  normotropic skin  noted bilaterally.  No signs of infections or ulcers noted.   Porokeratosis sub 1 right foot.  Onychomycosis  Pain in toes right foot  Pain in toes left foot.  Porokeratosis right foot.  Debridement  of nails  1-5  B/L with a nail nipper.  Nails were then filed using a dremel tool with no incidents. Debride callus with # 15 blade.   RTC  3 months.   Donicia Druck DPM  

## 2024-03-20 DIAGNOSIS — K625 Hemorrhage of anus and rectum: Secondary | ICD-10-CM | POA: Diagnosis not present

## 2024-03-20 DIAGNOSIS — K602 Anal fissure, unspecified: Secondary | ICD-10-CM | POA: Diagnosis not present

## 2024-03-28 DIAGNOSIS — I10 Essential (primary) hypertension: Secondary | ICD-10-CM | POA: Diagnosis not present

## 2024-03-28 DIAGNOSIS — M109 Gout, unspecified: Secondary | ICD-10-CM | POA: Diagnosis not present

## 2024-05-15 ENCOUNTER — Ambulatory Visit: Payer: Medicare Other | Admitting: Podiatry

## 2024-05-15 ENCOUNTER — Encounter: Payer: Self-pay | Admitting: Podiatry

## 2024-05-15 DIAGNOSIS — B351 Tinea unguium: Secondary | ICD-10-CM | POA: Diagnosis not present

## 2024-05-15 DIAGNOSIS — E119 Type 2 diabetes mellitus without complications: Secondary | ICD-10-CM | POA: Diagnosis not present

## 2024-05-15 DIAGNOSIS — M79676 Pain in unspecified toe(s): Secondary | ICD-10-CM

## 2024-05-15 NOTE — Progress Notes (Signed)
 This patient returns to the office for evaluation and treatment of long thick painful nails .  This patient is unable to trim his own nails since the patient cannot reach the feet.  Patient says the nails are painful walking and wearing his shoes. He also says he has a painful callus under the big toe joint right foot.  This callus is painful walking and wearing her shoes.   He returns for preventive foot care services.  Patient is diabetic on Januvia.  General Appearance  Alert, conversant and in no acute stress.  Vascular  Dorsalis pedis and posterior tibial  pulses are palpable  bilaterally.  Capillary return is within normal limits  bilaterally. Temperature is within normal limits  bilaterally.  Neurologic  Senn-Weinstein monofilament wire test within normal limits  bilaterally. Muscle power within normal limits bilaterally.  Nails Thick disfigured discolored nails with subungual debris  from hallux to fifth toes bilaterally. No evidence of bacterial infection or drainage bilaterally.  Orthopedic  No limitations of motion  feet .  No crepitus or effusions noted.  No bony pathology or digital deformities noted.  Skin  normotropic skin  noted bilaterally.  No signs of infections or ulcers noted.   Porokeratosis sub 1 right foot asymptomatic.  Onychomycosis  Pain in toes right foot  Pain in toes left foot.    Debridement  of nails  1-5  B/L with a nail nipper.  Nails were then filed using a dremel tool with no incidents. Aaron Aas  RTC  3 months    Ruffin Cotton DPM

## 2024-08-15 ENCOUNTER — Ambulatory Visit: Admitting: Podiatry

## 2024-09-03 ENCOUNTER — Encounter: Payer: Self-pay | Admitting: Podiatry

## 2024-09-03 ENCOUNTER — Ambulatory Visit: Admitting: Podiatry

## 2024-09-03 DIAGNOSIS — E119 Type 2 diabetes mellitus without complications: Secondary | ICD-10-CM

## 2024-09-03 DIAGNOSIS — L608 Other nail disorders: Secondary | ICD-10-CM | POA: Diagnosis not present

## 2024-09-03 DIAGNOSIS — M79676 Pain in unspecified toe(s): Secondary | ICD-10-CM | POA: Diagnosis not present

## 2024-09-03 DIAGNOSIS — B351 Tinea unguium: Secondary | ICD-10-CM

## 2024-09-03 NOTE — Progress Notes (Signed)
 This patient returns to the office for evaluation and treatment of long thick painful nails .  This patient is unable to trim his own nails since the patient cannot reach the feet.  Patient says the nails are painful walking and wearing his shoes. He also says he has a painful callus under the big toe joint right foot.  This callus is painful walking and wearing her shoes.   He returns for preventive foot care services.  Patient is diabetic on Januvia.  General Appearance  Alert, conversant and in no acute stress.  Vascular  Dorsalis pedis and posterior tibial  pulses are palpable  bilaterally.  Capillary return is within normal limits  bilaterally. Temperature is within normal limits  bilaterally.  Neurologic  Senn-Weinstein monofilament wire test within normal limits  bilaterally. Muscle power within normal limits bilaterally.  Nails Thick disfigured discolored nails with subungual debris  from hallux to fifth toes bilaterally. No evidence of bacterial infection or drainage bilaterally.  Orthopedic  No limitations of motion  feet .  No crepitus or effusions noted.  No bony pathology or digital deformities noted.  Skin  normotropic skin  noted bilaterally.  No signs of infections or ulcers noted.   Porokeratosis sub 1 right foot asymptomatic.  Onychomycosis  Pain in toes right foot  Pain in toes left foot.    Debridement  of nails  1-5  B/L with a nail nipper.  Nails were then filed using a dremel tool with no incidents. Aaron Aas  RTC  3 months    Ruffin Cotton DPM

## 2024-09-19 DIAGNOSIS — R350 Frequency of micturition: Secondary | ICD-10-CM | POA: Diagnosis not present

## 2024-09-26 DIAGNOSIS — Z0000 Encounter for general adult medical examination without abnormal findings: Secondary | ICD-10-CM | POA: Diagnosis not present

## 2024-09-26 DIAGNOSIS — I10 Essential (primary) hypertension: Secondary | ICD-10-CM | POA: Diagnosis not present

## 2024-09-26 DIAGNOSIS — E785 Hyperlipidemia, unspecified: Secondary | ICD-10-CM | POA: Diagnosis not present

## 2024-09-26 DIAGNOSIS — E1165 Type 2 diabetes mellitus with hyperglycemia: Secondary | ICD-10-CM | POA: Diagnosis not present

## 2024-09-26 DIAGNOSIS — M818 Other osteoporosis without current pathological fracture: Secondary | ICD-10-CM | POA: Diagnosis not present

## 2024-09-26 DIAGNOSIS — R350 Frequency of micturition: Secondary | ICD-10-CM | POA: Diagnosis not present

## 2024-09-26 DIAGNOSIS — M130 Polyarthritis, unspecified: Secondary | ICD-10-CM | POA: Diagnosis not present

## 2024-09-26 DIAGNOSIS — N3000 Acute cystitis without hematuria: Secondary | ICD-10-CM | POA: Diagnosis not present

## 2024-12-05 ENCOUNTER — Ambulatory Visit: Admitting: Podiatry

## 2024-12-05 ENCOUNTER — Encounter: Payer: Self-pay | Admitting: Podiatry

## 2024-12-05 DIAGNOSIS — B351 Tinea unguium: Secondary | ICD-10-CM

## 2024-12-05 DIAGNOSIS — L608 Other nail disorders: Secondary | ICD-10-CM

## 2024-12-05 DIAGNOSIS — M79676 Pain in unspecified toe(s): Secondary | ICD-10-CM | POA: Diagnosis not present

## 2024-12-05 DIAGNOSIS — Q828 Other specified congenital malformations of skin: Secondary | ICD-10-CM | POA: Diagnosis not present

## 2024-12-05 DIAGNOSIS — E119 Type 2 diabetes mellitus without complications: Secondary | ICD-10-CM | POA: Diagnosis not present

## 2024-12-05 NOTE — Progress Notes (Signed)
 This patient returns to the office for evaluation and treatment of long thick painful nails .  This patient is unable to trim his own nails since the patient cannot reach the feet.  Patient says the nails are painful walking and wearing his shoes. He also says he has a painful callus under the big toe joint right foot.  This callus is painful walking and wearing her shoes.   He returns for preventive foot care services.  Patient is diabetic on Januvia.  General Appearance  Alert, conversant and in no acute stress.  Vascular  Dorsalis pedis and posterior tibial  pulses are palpable  bilaterally.  Capillary return is within normal limits  bilaterally. Temperature is within normal limits  bilaterally.  Neurologic  Senn-Weinstein monofilament wire test within normal limits  bilaterally. Muscle power within normal limits bilaterally.  Nails Thick disfigured discolored nails with subungual debris  from hallux to fifth toes bilaterally. No evidence of bacterial infection or drainage bilaterally.  Orthopedic  No limitations of motion  feet .  No crepitus or effusions noted.  No bony pathology or digital deformities noted.  Skin  normotropic skin  noted bilaterally.  No signs of infections or ulcers noted.   Porokeratosis sub 1 right foot asymptomatic.  Onychomycosis  Pain in toes right foot  Pain in toes left foot.    Debridement  of nails  1-5  B/L with a nail nipper.  Nails were then filed using a dremel tool with no incidents. Aaron Aas  RTC  3 months    Ruffin Cotton DPM

## 2025-01-16 ENCOUNTER — Ambulatory Visit: Admitting: Podiatry

## 2025-01-16 ENCOUNTER — Ambulatory Visit (INDEPENDENT_AMBULATORY_CARE_PROVIDER_SITE_OTHER)

## 2025-01-16 DIAGNOSIS — M7732 Calcaneal spur, left foot: Secondary | ICD-10-CM

## 2025-01-16 DIAGNOSIS — M7662 Achilles tendinitis, left leg: Secondary | ICD-10-CM | POA: Diagnosis not present

## 2025-01-16 DIAGNOSIS — M7752 Other enthesopathy of left foot: Secondary | ICD-10-CM

## 2025-01-16 MED ORDER — TRIAMCINOLONE ACETONIDE 40 MG/ML IJ SUSP
40.0000 mg | Freq: Once | INTRAMUSCULAR | Status: AC
  Administered 2025-01-16: 40 mg

## 2025-01-16 NOTE — Patient Instructions (Signed)

## 2025-01-16 NOTE — Progress Notes (Signed)
 Presents today complaining posterior aspect of the heel started about 4 days ago stood up suddenly started hurting does not remember feeling a pop.  Has had plantar fasciitis in the past.  Does not recall any injury.  Has not noticed any redness or ecchymosis.   Physical exam:  General appearance: Pleasant, and in no acute distress. AOx3.  Vascular: Pedal pulses: DP 2/4 bilaterally, PT 2/4 bilaterally.  Mild edema lower legs bilaterally. Capillary fill time immediate bilaterally.  Neurological: Light touch intact feet bilaterally.  Normal Achilles reflex bilaterally.  No clonus or spasticity noted.  Negative Tinel sign tarsal tunnel and porta pedis and sural nerve bilaterally  Dermatologic:   Skin normal temperature bilaterally.  Skin normal color, tone, and texture bilaterally.   Musculoskeletal: Tenderness in the posterior aspect of the heel at the insertion of the Achilles tendon left.  Some tenderness about 8 or 9 cm proximal to the Achilles tendon with some thickening of the Achilles tendon noted.  No evidence of any defect in Achilles tendon left.  Some tenderness plantar medial heel at the medial plantar calcaneal tubercle left.  Radiographs: 3 views foot left: Osteophytic changes posterior aspect calcaneus.  Haglund's deformity calcaneus.  Notes any fractures or dislocations.  Kager's triangle intact.  No abnormal soft tissue densities.  Diagnosis: 1.  Achilles tendinitis left. 2.  Calcaneal spur left.  Plan: -Discussed with Achilles tendinitis and etiology and treatment.  Probably has some plantar fasciitis also.  Avoid flat soled shoes or going in bare feet.  Wear good supportive shoes. -Gave written instructions on at home therapy he can do.  - Injected 3cc 2:1 mixture 0.5 cc Marcaine: Triamcinolone  40mg /51ml around Achilles tendon and posterior heel left.    Return 1 week follow-up injection Achilles tendon left

## 2025-01-23 ENCOUNTER — Ambulatory Visit: Admitting: Podiatry

## 2025-01-23 DIAGNOSIS — M722 Plantar fascial fibromatosis: Secondary | ICD-10-CM

## 2025-01-23 DIAGNOSIS — M7662 Achilles tendinitis, left leg: Secondary | ICD-10-CM

## 2025-01-23 MED ORDER — PREDNISONE 5 MG PO TABS: ORAL_TABLET | ORAL | 1 refills | Status: AC

## 2025-01-23 NOTE — Progress Notes (Signed)
 Patient presents with a follow-up Achilles tendinitis and heel pain plantar and posterior aspect calcaneus.  Injection really did not help much.  Still about the same levels pain as before.   Physical exam:  General appearance: Pleasant, and in no acute distress. AOx3.  Vascular: Pedal pulses: DP 2/4 bilaterally, PT 2/4 bilaterally.  Mild edema lower legs bilaterally. Capillary fill time immediate bilaterally.  Neurological: Grossly intact bilaterally.  Negative Tinel sign tarsal tunnel and porta pedis left  Dermatologic:   Skin normal temperature bilaterally.  Skin normal color, tone, and texture bilaterally.   Musculoskeletal: Tenderness posterior aspect heel at insertion Achilles tendon left.  Some tenderness at the plantar medial aspect of calcaneus at the medial plantar calcaneal tubercle.  No tenderness to lateral compression of the calcaneus.  Diagnosis: 1.  Achilles tendinitis left. 2.  Plantar fasciitis left.   Plan: -Established office visit for evaluation and management level 3. - Discussed with him the Achilles tendinitis and some plantar fasciitis.  Continue exercises.  Recommended icing 20 minutes an hour 3-4 times a day.  Will try night splint also as well for the plantar fasciitis and the Achilles tendinitis left.  Continue wearing good supportive shoes.  Since he did not really respond with the corticosteroid injection last visit we will try some oral prednisone  today.  -Dispensed night splint left  - Rx prednisone  5 mg, 30 mg p.o. daily first day, then decrease by 5 mg every other day for 12 days.    Return 2 weeks follow-up

## 2025-02-06 ENCOUNTER — Ambulatory Visit: Admitting: Podiatry

## 2025-03-06 ENCOUNTER — Ambulatory Visit: Admitting: Podiatry
